# Patient Record
Sex: Male | Born: 1985 | Race: Black or African American | Hispanic: No | Marital: Single | Smoking: Current every day smoker
Health system: Southern US, Community
[De-identification: ages and names within clinical notes are randomized; demographics above are authoritative.]

## PROBLEM LIST (undated history)

## (undated) DIAGNOSIS — G4730 Sleep apnea, unspecified: Secondary | ICD-10-CM

## (undated) DIAGNOSIS — J45909 Unspecified asthma, uncomplicated: Secondary | ICD-10-CM

---

## 2020-10-09 ENCOUNTER — Emergency Department: Payer: Medicaid - Out of State

## 2020-10-09 ENCOUNTER — Encounter: Payer: Self-pay | Admitting: Emergency Medicine

## 2020-10-09 ENCOUNTER — Emergency Department
Admission: EM | Admit: 2020-10-09 | Discharge: 2020-10-09 | Disposition: A | Discharge status: Other Acute Inpatient Hospital | Payer: Medicaid - Out of State | Attending: Emergency Medicine | Admitting: Emergency Medicine

## 2020-10-09 ENCOUNTER — Other Ambulatory Visit: Payer: Self-pay

## 2020-10-09 DIAGNOSIS — E876 Hypokalemia: Secondary | ICD-10-CM | POA: Insufficient documentation

## 2020-10-09 DIAGNOSIS — T23202A Burn of second degree of left hand, unspecified site, initial encounter: Secondary | ICD-10-CM | POA: Insufficient documentation

## 2020-10-09 DIAGNOSIS — T23201A Burn of second degree of right hand, unspecified site, initial encounter: Secondary | ICD-10-CM | POA: Insufficient documentation

## 2020-10-09 DIAGNOSIS — F17210 Nicotine dependence, cigarettes, uncomplicated: Secondary | ICD-10-CM | POA: Insufficient documentation

## 2020-10-09 DIAGNOSIS — Y902 Blood alcohol level of 40-59 mg/100 ml: Secondary | ICD-10-CM | POA: Diagnosis not present

## 2020-10-09 DIAGNOSIS — Z20822 Contact with and (suspected) exposure to covid-19: Secondary | ICD-10-CM | POA: Diagnosis not present

## 2020-10-09 DIAGNOSIS — X04XXXA Exposure to ignition of highly flammable material, initial encounter: Secondary | ICD-10-CM | POA: Diagnosis not present

## 2020-10-09 DIAGNOSIS — T2015XA Burn of first degree of scalp [any part], initial encounter: Secondary | ICD-10-CM | POA: Diagnosis not present

## 2020-10-09 DIAGNOSIS — Z23 Encounter for immunization: Secondary | ICD-10-CM | POA: Diagnosis not present

## 2020-10-09 DIAGNOSIS — T300 Burn of unspecified body region, unspecified degree: Secondary | ICD-10-CM

## 2020-10-09 DIAGNOSIS — E872 Acidosis, unspecified: Secondary | ICD-10-CM

## 2020-10-09 DIAGNOSIS — S0990XA Unspecified injury of head, initial encounter: Secondary | ICD-10-CM | POA: Insufficient documentation

## 2020-10-09 LAB — LACTIC ACID, PLASMA
Lactic Acid, Venous: 4.2 mmol/L (ref 0.5–1.9)
Lactic Acid, Venous: 2.2 mmol/L (ref 0.5–1.9)

## 2020-10-09 LAB — CBC
HCT: 48.1 % (ref 39.0–52.0)
Hemoglobin: 16.3 g/dL (ref 13.0–17.0)
MCH: 30.3 pg (ref 26.0–34.0)
MCHC: 33.9 g/dL (ref 30.0–36.0)
MCV: 89.4 fL (ref 80.0–100.0)
Platelets: 251 10*3/uL (ref 150–400)
RBC: 5.38 MIL/uL (ref 4.22–5.81)
RDW: 14.2 % (ref 11.5–15.5)
WBC: 10.1 10*3/uL (ref 4.0–10.5)
nRBC: 0 % (ref 0.0–0.2)

## 2020-10-09 LAB — COMPREHENSIVE METABOLIC PANEL
ALT: 35 U/L (ref 0–44)
AST: 46 U/L — ABNORMAL HIGH (ref 15–41)
Albumin: 4.1 g/dL (ref 3.5–5.0)
Alkaline Phosphatase: 50 U/L (ref 38–126)
Anion gap: 12 (ref 5–15)
BUN: 17 mg/dL (ref 6–20)
CO2: 23 mmol/L (ref 22–32)
Calcium: 8.7 mg/dL — ABNORMAL LOW (ref 8.9–10.3)
Chloride: 103 mmol/L (ref 98–111)
Creatinine, Ser: 1.15 mg/dL (ref 0.61–1.24)
GFR, Estimated: >60 mL/min (ref >60)
Glucose, Bld: 117 mg/dL — ABNORMAL HIGH (ref 70–99)
Potassium: 3 mmol/L — ABNORMAL LOW (ref 3.5–5.1)
Sodium: 138 mmol/L (ref 135–145)
Total Bilirubin: 0.6 mg/dL (ref 0.3–1.2)
Total Protein: 7.5 g/dL (ref 6.5–8.1)

## 2020-10-09 LAB — SAMPLE TO BLOOD BANK

## 2020-10-09 LAB — MAGNESIUM: Magnesium: 2.1 mg/dL (ref 1.7–2.4)

## 2020-10-09 LAB — PROTIME-INR
INR: 1 (ref 0.8–1.2)
Prothrombin Time: 12.9 seconds (ref 11.4–15.2)

## 2020-10-09 LAB — COOXEMETRY PANEL
Carboxyhemoglobin: 6.4 % (ref 0.5–1.5)
Methemoglobin: 0.3 % (ref 0.0–1.5)
O2 Saturation: 95.9 %
Total oxygen content: 91 mL/dL

## 2020-10-09 LAB — RESP PANEL BY RT-PCR (FLU A&B, COVID) ARPGX2
Influenza A by PCR: NEGATIVE
Influenza B by PCR: NEGATIVE
SARS Coronavirus 2 by RT PCR: NEGATIVE

## 2020-10-09 LAB — ETHANOL: Alcohol, Ethyl (B): 51 mg/dL — ABNORMAL HIGH (ref <10)

## 2020-10-09 LAB — TROPONIN I (HIGH SENSITIVITY): Troponin I (High Sensitivity): 8 ng/L (ref <18)

## 2020-10-09 MED ORDER — ACETAMINOPHEN 500 MG PO TABS
1000.0000 mg | ORAL_TABLET | Freq: Once | ORAL | Status: AC
  Administered 2020-10-09: 04:00:00 1000 mg via ORAL
  Filled 2020-10-09: qty 2

## 2020-10-09 MED ORDER — HYDROMORPHONE HCL 1 MG/ML IJ SOLN
0.5000 mg | Freq: Once | INTRAMUSCULAR | Status: AC
Start: 2020-10-09 — End: 2020-10-09
  Administered 2020-10-09: 08:00:00 0.5 mg via INTRAVENOUS
  Filled 2020-10-09: qty 1

## 2020-10-09 MED ORDER — HYDROMORPHONE HCL 1 MG/ML IJ SOLN
0.5000 mg | Freq: Once | INTRAMUSCULAR | Status: AC
  Administered 2020-10-09: 06:00:00 0.5 mg via INTRAVENOUS
  Filled 2020-10-09: qty 1

## 2020-10-09 MED ORDER — IOHEXOL 300 MG/ML  SOLN
125.0000 mL | Freq: Once | INTRAMUSCULAR | Status: AC | PRN
  Administered 2020-10-09: 06:00:00 125 mL via INTRAVENOUS

## 2020-10-09 MED ORDER — POTASSIUM CHLORIDE CRYS ER 20 MEQ PO TBCR
80.0000 meq | EXTENDED_RELEASE_TABLET | Freq: Once | ORAL | Status: AC
  Administered 2020-10-09: 06:00:00 80 meq via ORAL
  Filled 2020-10-09: qty 4

## 2020-10-09 MED ORDER — LACTATED RINGERS IV BOLUS
1000.0000 mL | Freq: Once | INTRAVENOUS | Status: AC
  Administered 2020-10-09: 04:00:00 1000 mL via INTRAVENOUS

## 2020-10-09 MED ORDER — LACTATED RINGERS IV BOLUS
1000.0000 mL | Freq: Once | INTRAVENOUS | Status: AC
  Administered 2020-10-09: 07:00:00 1000 mL via INTRAVENOUS

## 2020-10-09 MED ORDER — TETANUS-DIPHTH-ACELL PERTUSSIS 5-2.5-18.5 LF-MCG/0.5 IM SUSY
0.5000 mL | PREFILLED_SYRINGE | Freq: Once | INTRAMUSCULAR | Status: AC
  Administered 2020-10-09: 04:00:00 0.5 mL via INTRAMUSCULAR
  Filled 2020-10-09: qty 0.5

## 2020-10-09 MED ORDER — KETOROLAC TROMETHAMINE 30 MG/ML IJ SOLN
30.0000 mg | Freq: Once | INTRAMUSCULAR | Status: AC
  Administered 2020-10-09: 08:00:00 30 mg via INTRAVENOUS
  Filled 2020-10-09: qty 1

## 2020-10-09 NOTE — ED Notes (Signed)
Dr Scotty Court notified of critical lactic acid 4.2
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
## 2020-10-09 NOTE — ED Notes (Signed)
ED Provider at bedside.  Provider now speaking with pt mother --pt now to CT dept via stretcher
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
## 2021-03-21 ENCOUNTER — Inpatient Hospital Stay (HOSPITAL_COMMUNITY): Payer: Self-pay

## 2021-03-21 ENCOUNTER — Other Ambulatory Visit: Payer: Self-pay

## 2021-03-21 ENCOUNTER — Encounter (HOSPITAL_COMMUNITY): Payer: Self-pay | Admitting: *Deleted

## 2021-03-21 ENCOUNTER — Emergency Department (HOSPITAL_COMMUNITY): Payer: Self-pay

## 2021-03-21 ENCOUNTER — Emergency Department (HOSPITAL_COMMUNITY): Payer: Self-pay | Admitting: Certified Registered"

## 2021-03-21 ENCOUNTER — Encounter (HOSPITAL_COMMUNITY)
Admission: EM | Disposition: A | Discharge status: Home / Self Care | Payer: Self-pay | Source: Home / Self Care | Attending: Orthopedic Surgery

## 2021-03-21 ENCOUNTER — Inpatient Hospital Stay (HOSPITAL_COMMUNITY)
Admission: EM | Admit: 2021-03-21 | Discharge: 2021-03-27 | DRG: 956 | Disposition: A | Discharge status: Home / Self Care | Payer: Self-pay | Attending: Orthopedic Surgery | Admitting: Orthopedic Surgery

## 2021-03-21 DIAGNOSIS — D649 Anemia, unspecified: Secondary | ICD-10-CM | POA: Diagnosis present

## 2021-03-21 DIAGNOSIS — S52511A Displaced fracture of right radial styloid process, initial encounter for closed fracture: Secondary | ICD-10-CM | POA: Diagnosis present

## 2021-03-21 DIAGNOSIS — S83271A Complex tear of lateral meniscus, current injury, right knee, initial encounter: Secondary | ICD-10-CM | POA: Diagnosis present

## 2021-03-21 DIAGNOSIS — N3090 Cystitis, unspecified without hematuria: Secondary | ICD-10-CM | POA: Diagnosis present

## 2021-03-21 DIAGNOSIS — G4730 Sleep apnea, unspecified: Secondary | ICD-10-CM | POA: Diagnosis present

## 2021-03-21 DIAGNOSIS — S27329A Contusion of lung, unspecified, initial encounter: Secondary | ICD-10-CM | POA: Diagnosis present

## 2021-03-21 DIAGNOSIS — S7291XA Unspecified fracture of right femur, initial encounter for closed fracture: Secondary | ICD-10-CM | POA: Diagnosis present

## 2021-03-21 DIAGNOSIS — Z20822 Contact with and (suspected) exposure to covid-19: Secondary | ICD-10-CM | POA: Diagnosis present

## 2021-03-21 DIAGNOSIS — F1290 Cannabis use, unspecified, uncomplicated: Secondary | ICD-10-CM | POA: Diagnosis present

## 2021-03-21 DIAGNOSIS — Z419 Encounter for procedure for purposes other than remedying health state, unspecified: Secondary | ICD-10-CM

## 2021-03-21 DIAGNOSIS — Z8781 Personal history of (healed) traumatic fracture: Secondary | ICD-10-CM

## 2021-03-21 DIAGNOSIS — S82141A Displaced bicondylar fracture of right tibia, initial encounter for closed fracture: Secondary | ICD-10-CM | POA: Diagnosis present

## 2021-03-21 DIAGNOSIS — Y92410 Unspecified street and highway as the place of occurrence of the external cause: Secondary | ICD-10-CM

## 2021-03-21 DIAGNOSIS — S72301A Unspecified fracture of shaft of right femur, initial encounter for closed fracture: Secondary | ICD-10-CM

## 2021-03-21 DIAGNOSIS — S52501A Unspecified fracture of the lower end of right radius, initial encounter for closed fracture: Secondary | ICD-10-CM | POA: Diagnosis present

## 2021-03-21 DIAGNOSIS — R000 Tachycardia, unspecified: Secondary | ICD-10-CM | POA: Diagnosis not present

## 2021-03-21 DIAGNOSIS — R509 Fever, unspecified: Secondary | ICD-10-CM | POA: Diagnosis not present

## 2021-03-21 DIAGNOSIS — J45909 Unspecified asthma, uncomplicated: Secondary | ICD-10-CM | POA: Diagnosis present

## 2021-03-21 DIAGNOSIS — S72114A Nondisplaced fracture of greater trochanter of right femur, initial encounter for closed fracture: Principal | ICD-10-CM | POA: Diagnosis present

## 2021-03-21 DIAGNOSIS — E876 Hypokalemia: Secondary | ICD-10-CM | POA: Diagnosis present

## 2021-03-21 DIAGNOSIS — I10 Essential (primary) hypertension: Secondary | ICD-10-CM | POA: Diagnosis not present

## 2021-03-21 DIAGNOSIS — Z6839 Body mass index (BMI) 39.0-39.9, adult: Secondary | ICD-10-CM

## 2021-03-21 DIAGNOSIS — E559 Vitamin D deficiency, unspecified: Secondary | ICD-10-CM | POA: Diagnosis present

## 2021-03-21 DIAGNOSIS — T148XXA Other injury of unspecified body region, initial encounter: Secondary | ICD-10-CM

## 2021-03-21 DIAGNOSIS — D62 Acute posthemorrhagic anemia: Secondary | ICD-10-CM | POA: Diagnosis not present

## 2021-03-21 DIAGNOSIS — T1490XA Injury, unspecified, initial encounter: Secondary | ICD-10-CM

## 2021-03-21 DIAGNOSIS — F17200 Nicotine dependence, unspecified, uncomplicated: Secondary | ICD-10-CM | POA: Diagnosis present

## 2021-03-21 HISTORY — DX: Sleep apnea, unspecified: G47.30

## 2021-03-21 HISTORY — PX: FEMUR IM NAIL: SHX1597

## 2021-03-21 HISTORY — DX: Unspecified asthma, uncomplicated: J45.909

## 2021-03-21 LAB — PROTIME-INR
INR: 1 (ref 0.8–1.2)
Prothrombin Time: 13.5 seconds (ref 11.4–15.2)

## 2021-03-21 LAB — COMPREHENSIVE METABOLIC PANEL
ALT: 16 U/L (ref 0–44)
AST: 22 U/L (ref 15–41)
Albumin: 3.8 g/dL (ref 3.5–5.0)
Alkaline Phosphatase: 53 U/L (ref 38–126)
Anion gap: 11 (ref 5–15)
BUN: 10 mg/dL (ref 6–20)
CO2: 25 mmol/L (ref 22–32)
Calcium: 8.6 mg/dL — ABNORMAL LOW (ref 8.9–10.3)
Chloride: 100 mmol/L (ref 98–111)
Creatinine, Ser: 1.29 mg/dL — ABNORMAL HIGH (ref 0.61–1.24)
GFR, Estimated: >60 mL/min (ref >60)
Glucose, Bld: 117 mg/dL — ABNORMAL HIGH (ref 70–99)
Potassium: 3.1 mmol/L — ABNORMAL LOW (ref 3.5–5.1)
Sodium: 136 mmol/L (ref 135–145)
Total Bilirubin: 0.6 mg/dL (ref 0.3–1.2)
Total Protein: 6.4 g/dL — ABNORMAL LOW (ref 6.5–8.1)

## 2021-03-21 LAB — SAMPLE TO BLOOD BANK

## 2021-03-21 LAB — URINALYSIS, ROUTINE W REFLEX MICROSCOPIC
Bilirubin Urine: NEGATIVE
Glucose, UA: NEGATIVE mg/dL
Hgb urine dipstick: NEGATIVE
Ketones, ur: NEGATIVE mg/dL
Nitrite: NEGATIVE
Protein, ur: NEGATIVE mg/dL
Specific Gravity, Urine: >1.046 — ABNORMAL HIGH (ref 1.005–1.030)
pH: 5 (ref 5.0–8.0)

## 2021-03-21 LAB — CBC
HCT: 48.2 % (ref 39.0–52.0)
Hemoglobin: 15.9 g/dL (ref 13.0–17.0)
MCH: 29.9 pg (ref 26.0–34.0)
MCHC: 33 g/dL (ref 30.0–36.0)
MCV: 90.8 fL (ref 80.0–100.0)
Platelets: 228 10*3/uL (ref 150–400)
RBC: 5.31 MIL/uL (ref 4.22–5.81)
RDW: 14.4 % (ref 11.5–15.5)
WBC: 9.2 10*3/uL (ref 4.0–10.5)
nRBC: 0 % (ref 0.0–0.2)

## 2021-03-21 LAB — LACTIC ACID, PLASMA: Lactic Acid, Venous: 3.8 mmol/L (ref 0.5–1.9)

## 2021-03-21 LAB — ETHANOL: Alcohol, Ethyl (B): 19 mg/dL — ABNORMAL HIGH (ref <10)

## 2021-03-21 LAB — RESP PANEL BY RT-PCR (FLU A&B, COVID) ARPGX2
Influenza A by PCR: NEGATIVE
Influenza B by PCR: NEGATIVE
SARS Coronavirus 2 by RT PCR: NEGATIVE

## 2021-03-21 LAB — SURGICAL PCR SCREEN
MRSA, PCR: NEGATIVE
Staphylococcus aureus: NEGATIVE

## 2021-03-21 SURGERY — INSERTION, INTRAMEDULLARY ROD, FEMUR
Anesthesia: General | Site: Leg Upper | Laterality: Right

## 2021-03-21 MED ORDER — SUCCINYLCHOLINE CHLORIDE 200 MG/10ML IV SOSY
PREFILLED_SYRINGE | INTRAVENOUS | Status: AC
  Filled 2021-03-21: qty 10

## 2021-03-21 MED ORDER — FENTANYL CITRATE (PF) 100 MCG/2ML IJ SOLN
INTRAMUSCULAR | Status: AC
  Filled 2021-03-21: qty 2

## 2021-03-21 MED ORDER — MORPHINE SULFATE (PF) 4 MG/ML IV SOLN
INTRAVENOUS | Status: DC | PRN
  Administered 2021-03-21: 8 mg

## 2021-03-21 MED ORDER — HYDROMORPHONE HCL 1 MG/ML IJ SOLN
1.0000 mg | Freq: Once | INTRAMUSCULAR | Status: AC
  Administered 2021-03-21: 1 mg via INTRAVENOUS
  Filled 2021-03-21: qty 1

## 2021-03-21 MED ORDER — DOCUSATE SODIUM 100 MG PO CAPS
100.0000 mg | ORAL_CAPSULE | Freq: Two times a day (BID) | ORAL | Status: DC
  Administered 2021-03-21 – 2021-03-27 (×12): 100 mg via ORAL
  Filled 2021-03-21 (×12): qty 1

## 2021-03-21 MED ORDER — TRANEXAMIC ACID-NACL 1000-0.7 MG/100ML-% IV SOLN
1000.0000 mg | Freq: Once | INTRAVENOUS | Status: AC
  Administered 2021-03-21: 1000 mg via INTRAVENOUS
  Filled 2021-03-21: qty 100

## 2021-03-21 MED ORDER — LIDOCAINE 2% (20 MG/ML) 5 ML SYRINGE
INTRAMUSCULAR | Status: DC | PRN
  Administered 2021-03-21: 100 mg via INTRAVENOUS

## 2021-03-21 MED ORDER — CHLORHEXIDINE GLUCONATE 0.12 % MT SOLN
OROMUCOSAL | Status: AC
  Administered 2021-03-21: 15 mL via OROMUCOSAL
  Filled 2021-03-21: qty 15

## 2021-03-21 MED ORDER — MIDAZOLAM HCL 5 MG/5ML IJ SOLN
INTRAMUSCULAR | Status: DC | PRN
  Administered 2021-03-21 (×2): 1 mg via INTRAVENOUS

## 2021-03-21 MED ORDER — CHLORHEXIDINE GLUCONATE 4 % EX LIQD: 60.0000 mL | Freq: Once | CUTANEOUS | Status: DC

## 2021-03-21 MED ORDER — SODIUM CHLORIDE 0.9 % IV SOLN: INTRAVENOUS | Status: DC

## 2021-03-21 MED ORDER — BUPIVACAINE HCL (PF) 0.25 % IJ SOLN
INTRAMUSCULAR | Status: AC
  Filled 2021-03-21: qty 30

## 2021-03-21 MED ORDER — SUGAMMADEX SODIUM 200 MG/2ML IV SOLN
INTRAVENOUS | Status: DC | PRN
  Administered 2021-03-21: 300 mg via INTRAVENOUS

## 2021-03-21 MED ORDER — TRANEXAMIC ACID-NACL 1000-0.7 MG/100ML-% IV SOLN
1000.0000 mg | INTRAVENOUS | Status: AC
  Administered 2021-03-21: 1000 mg via INTRAVENOUS
  Filled 2021-03-21: qty 100

## 2021-03-21 MED ORDER — METOCLOPRAMIDE HCL 5 MG PO TABS
5.0000 mg | ORAL_TABLET | Freq: Three times a day (TID) | ORAL | Status: DC | PRN
Start: 2021-03-21 — End: 2021-03-27
  Filled 2021-03-21: qty 2

## 2021-03-21 MED ORDER — OXYCODONE HCL 5 MG PO TABS
5.0000 mg | ORAL_TABLET | ORAL | Status: DC | PRN
Start: 2021-03-21 — End: 2021-03-27
  Administered 2021-03-21 – 2021-03-23 (×8): 10 mg via ORAL
  Administered 2021-03-23: 5 mg via ORAL
  Administered 2021-03-24 – 2021-03-27 (×13): 10 mg via ORAL
  Administered 2021-03-27: 5 mg via ORAL
  Administered 2021-03-27 (×2): 10 mg via ORAL
  Filled 2021-03-21 (×6): qty 2
  Filled 2021-03-21: qty 1
  Filled 2021-03-21 (×17): qty 2

## 2021-03-21 MED ORDER — DEXAMETHASONE SODIUM PHOSPHATE 4 MG/ML IJ SOLN
INTRAMUSCULAR | Status: DC | PRN
  Administered 2021-03-21: 4 mg via INTRAVENOUS

## 2021-03-21 MED ORDER — ROCURONIUM BROMIDE 10 MG/ML (PF) SYRINGE
PREFILLED_SYRINGE | INTRAVENOUS | Status: AC
  Filled 2021-03-21: qty 10

## 2021-03-21 MED ORDER — MIDAZOLAM HCL 2 MG/2ML IJ SOLN
INTRAMUSCULAR | Status: AC
  Filled 2021-03-21: qty 2

## 2021-03-21 MED ORDER — FENTANYL CITRATE (PF) 100 MCG/2ML IJ SOLN
INTRAMUSCULAR | Status: DC | PRN
  Administered 2021-03-21 (×5): 50 ug via INTRAVENOUS

## 2021-03-21 MED ORDER — LACTATED RINGERS IV SOLN: INTRAVENOUS | Status: AC

## 2021-03-21 MED ORDER — IOHEXOL 300 MG/ML  SOLN
100.0000 mL | Freq: Once | INTRAMUSCULAR | Status: AC | PRN
  Administered 2021-03-21: 100 mL via INTRAVENOUS

## 2021-03-21 MED ORDER — PROPOFOL 10 MG/ML IV BOLUS
INTRAVENOUS | Status: DC | PRN
  Administered 2021-03-21: 200 mg via INTRAVENOUS

## 2021-03-21 MED ORDER — FENTANYL CITRATE (PF) 250 MCG/5ML IJ SOLN
INTRAMUSCULAR | Status: AC
  Filled 2021-03-21: qty 5

## 2021-03-21 MED ORDER — CLONIDINE HCL (ANALGESIA) 100 MCG/ML EP SOLN
EPIDURAL | Status: DC | PRN
  Administered 2021-03-21: 1 mL

## 2021-03-21 MED ORDER — SODIUM CHLORIDE 0.9 % IV BOLUS
1000.0000 mL | Freq: Once | INTRAVENOUS | Status: AC
  Administered 2021-03-21: 1000 mL via INTRAVENOUS

## 2021-03-21 MED ORDER — ONDANSETRON HCL 4 MG PO TABS: 4.0000 mg | ORAL_TABLET | Freq: Four times a day (QID) | ORAL | Status: DC | PRN

## 2021-03-21 MED ORDER — POTASSIUM CHLORIDE CRYS ER 20 MEQ PO TBCR
20.0000 meq | EXTENDED_RELEASE_TABLET | Freq: Every day | ORAL | Status: DC
  Administered 2021-03-22 – 2021-03-27 (×5): 20 meq via ORAL
  Filled 2021-03-21 (×7): qty 1

## 2021-03-21 MED ORDER — METHOCARBAMOL 500 MG PO TABS
500.0000 mg | ORAL_TABLET | Freq: Four times a day (QID) | ORAL | Status: DC | PRN
  Administered 2021-03-21 – 2021-03-27 (×10): 500 mg via ORAL
  Filled 2021-03-21 (×9): qty 1

## 2021-03-21 MED ORDER — METHOCARBAMOL 1000 MG/10ML IJ SOLN
500.0000 mg | Freq: Four times a day (QID) | INTRAVENOUS | Status: DC | PRN
  Filled 2021-03-21: qty 5

## 2021-03-21 MED ORDER — BUPIVACAINE HCL 0.25 % IJ SOLN
INTRAMUSCULAR | Status: DC | PRN
  Administered 2021-03-21: 30 mL

## 2021-03-21 MED ORDER — METOCLOPRAMIDE HCL 5 MG/ML IJ SOLN
5.0000 mg | Freq: Three times a day (TID) | INTRAMUSCULAR | Status: DC | PRN
Start: 2021-03-21 — End: 2021-03-27

## 2021-03-21 MED ORDER — ONDANSETRON HCL 4 MG/2ML IJ SOLN
INTRAMUSCULAR | Status: AC
  Filled 2021-03-21: qty 2

## 2021-03-21 MED ORDER — CEFAZOLIN SODIUM-DEXTROSE 2-4 GM/100ML-% IV SOLN
2.0000 g | INTRAVENOUS | Status: AC
  Administered 2021-03-21: 2 g via INTRAVENOUS
  Filled 2021-03-21: qty 100

## 2021-03-21 MED ORDER — PHENYLEPHRINE 40 MCG/ML (10ML) SYRINGE FOR IV PUSH (FOR BLOOD PRESSURE SUPPORT)
PREFILLED_SYRINGE | INTRAVENOUS | Status: AC
  Filled 2021-03-21: qty 10

## 2021-03-21 MED ORDER — CLONIDINE HCL (ANALGESIA) 100 MCG/ML EP SOLN
EPIDURAL | Status: AC
  Filled 2021-03-21: qty 10

## 2021-03-21 MED ORDER — LIDOCAINE 2% (20 MG/ML) 5 ML SYRINGE
INTRAMUSCULAR | Status: AC
  Filled 2021-03-21: qty 5

## 2021-03-21 MED ORDER — POVIDONE-IODINE 10 % EX SWAB: 2.0000 "application " | Freq: Once | CUTANEOUS | Status: DC

## 2021-03-21 MED ORDER — ONDANSETRON HCL 4 MG/2ML IJ SOLN
INTRAMUSCULAR | Status: DC | PRN
  Administered 2021-03-21: 4 mg via INTRAVENOUS

## 2021-03-21 MED ORDER — PROMETHAZINE HCL 25 MG/ML IJ SOLN: 6.2500 mg | INTRAMUSCULAR | Status: DC | PRN

## 2021-03-21 MED ORDER — MORPHINE SULFATE (PF) 4 MG/ML IV SOLN
INTRAVENOUS | Status: AC
  Filled 2021-03-21: qty 2

## 2021-03-21 MED ORDER — PHENOL 1.4 % MT LIQD
1.0000 | OROMUCOSAL | Status: DC | PRN
Start: 2021-03-21 — End: 2021-03-27

## 2021-03-21 MED ORDER — CEPHALEXIN 500 MG PO CAPS
500.0000 mg | ORAL_CAPSULE | Freq: Two times a day (BID) | ORAL | Status: DC
  Administered 2021-03-21 – 2021-03-22 (×3): 500 mg via ORAL
  Filled 2021-03-21 (×3): qty 1
  Filled 2021-03-21: qty 2

## 2021-03-21 MED ORDER — ASPIRIN EC 325 MG PO TBEC
325.0000 mg | DELAYED_RELEASE_TABLET | Freq: Every day | ORAL | Status: DC
  Administered 2021-03-22: 325 mg via ORAL
  Filled 2021-03-21: qty 1

## 2021-03-21 MED ORDER — ONDANSETRON HCL 4 MG/2ML IJ SOLN: 4.0000 mg | Freq: Four times a day (QID) | INTRAMUSCULAR | Status: DC | PRN

## 2021-03-21 MED ORDER — PROPOFOL 10 MG/ML IV BOLUS
INTRAVENOUS | Status: AC
  Filled 2021-03-21: qty 20

## 2021-03-21 MED ORDER — 0.9 % SODIUM CHLORIDE (POUR BTL) OPTIME
TOPICAL | Status: DC | PRN
  Administered 2021-03-21 (×2): 1000 mL

## 2021-03-21 MED ORDER — ACETAMINOPHEN 500 MG PO TABS
1000.0000 mg | ORAL_TABLET | Freq: Four times a day (QID) | ORAL | Status: AC
  Administered 2021-03-21 – 2021-03-22 (×3): 1000 mg via ORAL
  Filled 2021-03-21 (×4): qty 2

## 2021-03-21 MED ORDER — CEFAZOLIN SODIUM-DEXTROSE 2-4 GM/100ML-% IV SOLN
2.0000 g | Freq: Three times a day (TID) | INTRAVENOUS | Status: AC
  Administered 2021-03-21 (×2): 2 g via INTRAVENOUS
  Filled 2021-03-21 (×2): qty 100

## 2021-03-21 MED ORDER — ORAL CARE MOUTH RINSE: 15.0000 mL | Freq: Once | OROMUCOSAL | Status: AC

## 2021-03-21 MED ORDER — FENTANYL CITRATE (PF) 100 MCG/2ML IJ SOLN
25.0000 ug | INTRAMUSCULAR | Status: DC | PRN
  Administered 2021-03-21 (×2): 50 ug via INTRAVENOUS

## 2021-03-21 MED ORDER — HYDROMORPHONE HCL 1 MG/ML IJ SOLN
0.5000 mg | INTRAMUSCULAR | Status: DC | PRN
  Administered 2021-03-23 – 2021-03-27 (×11): 0.5 mg via INTRAVENOUS
  Filled 2021-03-21 (×6): qty 1
  Filled 2021-03-21: qty 0.5
  Filled 2021-03-21: qty 1
  Filled 2021-03-21: qty 0.5
  Filled 2021-03-21 (×3): qty 1

## 2021-03-21 MED ORDER — MENTHOL 3 MG MT LOZG: 1.0000 | LOZENGE | OROMUCOSAL | Status: DC | PRN

## 2021-03-21 MED ORDER — ACETAMINOPHEN 325 MG PO TABS
325.0000 mg | ORAL_TABLET | Freq: Four times a day (QID) | ORAL | Status: DC | PRN
  Administered 2021-03-25: 650 mg via ORAL
  Filled 2021-03-21: qty 2

## 2021-03-21 MED ORDER — CHLORHEXIDINE GLUCONATE 0.12 % MT SOLN: 15.0000 mL | Freq: Once | OROMUCOSAL | Status: AC

## 2021-03-21 MED ORDER — LACTATED RINGERS IV SOLN: INTRAVENOUS | Status: DC

## 2021-03-21 MED ORDER — ACETAMINOPHEN 10 MG/ML IV SOLN: 1000.0000 mg | Freq: Once | INTRAVENOUS | Status: DC | PRN

## 2021-03-21 MED ORDER — ROCURONIUM BROMIDE 100 MG/10ML IV SOLN
INTRAVENOUS | Status: DC | PRN
  Administered 2021-03-21: 30 mg via INTRAVENOUS
  Administered 2021-03-21: 70 mg via INTRAVENOUS
  Administered 2021-03-21: 20 mg via INTRAVENOUS

## 2021-03-21 MED ORDER — DEXAMETHASONE SODIUM PHOSPHATE 10 MG/ML IJ SOLN
INTRAMUSCULAR | Status: AC
  Filled 2021-03-21: qty 1

## 2021-03-21 SURGICAL SUPPLY — 61 items
BIT DRILL LONG 4.0 (BIT) ×1 IMPLANT
BIT DRILL SHORT 4.0 (BIT) ×2 IMPLANT
BLADE CLIPPER SURG (BLADE) ×2 IMPLANT
BLADE SURG 15 STRL LF DISP TIS (BLADE) IMPLANT
BLADE SURG 15 STRL SS (BLADE)
COVER MAYO STAND STRL (DRAPES) ×2 IMPLANT
COVER PERINEAL POST (MISCELLANEOUS) ×2 IMPLANT
COVER SURGICAL LIGHT HANDLE (MISCELLANEOUS) ×2 IMPLANT
COVER WAND RF STERILE (DRAPES) ×2 IMPLANT
DRAPE HALF SHEET 40X57 (DRAPES) IMPLANT
DRAPE INCISE IOBAN 66X45 STRL (DRAPES) ×2 IMPLANT
DRAPE ORTHO SPLIT 77X108 STRL (DRAPES)
DRAPE STERI IOBAN 125X83 (DRAPES) ×2 IMPLANT
DRAPE SURG ORHT 6 SPLT 77X108 (DRAPES) IMPLANT
DRESSING AQUACEL AG SP 3.5X6 (GAUZE/BANDAGES/DRESSINGS) ×2 IMPLANT
DRILL BIT LONG 4.0 (BIT) ×2
DRILL BIT SHORT 4.0 (BIT) ×2
DRSG AQUACEL AG SP 3.5X6 (GAUZE/BANDAGES/DRESSINGS) ×4
DRSG MEPILEX BORDER 4X4 (GAUZE/BANDAGES/DRESSINGS) IMPLANT
DRSG MEPILEX BORDER 4X8 (GAUZE/BANDAGES/DRESSINGS) IMPLANT
DURAPREP 26ML APPLICATOR (WOUND CARE) ×2 IMPLANT
ELECT REM PT RETURN 9FT ADLT (ELECTROSURGICAL) ×2
ELECTRODE REM PT RTRN 9FT ADLT (ELECTROSURGICAL) ×1 IMPLANT
FACESHIELD WRAPAROUND (MASK) ×2 IMPLANT
GAUZE XEROFORM 5X9 LF (GAUZE/BANDAGES/DRESSINGS) IMPLANT
GLOVE ECLIPSE 8.0 STRL XLNG CF (GLOVE) ×4 IMPLANT
GLOVE SRG 8 PF TXTR STRL LF DI (GLOVE) ×1 IMPLANT
GLOVE SURG UNDER POLY LF SZ8 (GLOVE) ×1
GOWN STRL REUS W/ TWL LRG LVL3 (GOWN DISPOSABLE) ×2 IMPLANT
GOWN STRL REUS W/ TWL XL LVL3 (GOWN DISPOSABLE) ×2 IMPLANT
GOWN STRL REUS W/TWL LRG LVL3 (GOWN DISPOSABLE) ×2
GOWN STRL REUS W/TWL XL LVL3 (GOWN DISPOSABLE) ×2
GUIDE PIN 3.2X343 (PIN) ×2
GUIDE PIN 3.2X343MM (PIN) ×2
GUIDE ROD 3.0 (MISCELLANEOUS) ×2
KIT BASIN OR (CUSTOM PROCEDURE TRAY) ×2 IMPLANT
KIT TURNOVER KIT B (KITS) ×2 IMPLANT
MANIFOLD NEPTUNE II (INSTRUMENTS) IMPLANT
NAIL LOCK COMPR 11.5X42 RT (Nail) ×2 IMPLANT
NEEDLE HYPO 25GX1X1/2 BEV (NEEDLE) ×2 IMPLANT
NS IRRIG 1000ML POUR BTL (IV SOLUTION) ×2 IMPLANT
PACK GENERAL/GYN (CUSTOM PROCEDURE TRAY) ×2 IMPLANT
PAD ARMBOARD 7.5X6 YLW CONV (MISCELLANEOUS) ×4 IMPLANT
PIN GUIDE 3.2X343MM (PIN) ×2 IMPLANT
ROD GUIDE 3.0 (MISCELLANEOUS) ×1 IMPLANT
SCREW TRIGEN LOW PROF 5.0X47.5 (Screw) ×2 IMPLANT
SCREW TRIGEN LOW PROF 5.0X57.5 (Screw) ×2 IMPLANT
SCREW TRIGEN LOW PROF 5.0X72.5 (Screw) ×2 IMPLANT
SPONGE LAP 4X18 RFD (DISPOSABLE) IMPLANT
STAPLER VISISTAT 35W (STAPLE) ×2 IMPLANT
SUT ETHILON 2 0 FS 18 (SUTURE) IMPLANT
SUT ETHILON 3 0 FSL (SUTURE) ×4 IMPLANT
SUT VIC AB 0 CTX 36 (SUTURE) ×1
SUT VIC AB 0 CTX36XBRD ANBCTRL (SUTURE) ×1 IMPLANT
SUT VIC AB 1 CT1 27 (SUTURE) ×2
SUT VIC AB 1 CT1 27XBRD ANBCTR (SUTURE) ×2 IMPLANT
SUT VIC AB 2-0 CTB1 (SUTURE) ×4 IMPLANT
TAPE STRIPS DRAPE STRL (GAUZE/BANDAGES/DRESSINGS) ×2 IMPLANT
TOWEL GREEN STERILE (TOWEL DISPOSABLE) ×2 IMPLANT
TOWEL GREEN STERILE FF (TOWEL DISPOSABLE) ×2 IMPLANT
WATER STERILE IRR 1000ML POUR (IV SOLUTION) ×2 IMPLANT
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
## 2021-03-21 NOTE — Progress Notes (Signed)
Douglas Bishop is a 35 y.o. male patient admitted. Awake, alert - oriented  X 4 - no acute distress noted.  VSS - Blood pressure 131/84, pulse 88, temperature 97.7 F (36.5 C), temperature source Axillary, resp. rate 18, height 5\' 7"  (1.702 m), weight 113.4 kg, SpO2 95 %.    IV in place, occlusive dsg intact without redness.  Orientation to room, and floor completed.  Admission INP armband ID verified with patient/family, and in place.   SR up x 2, fall assessment complete, with patient and family able to verbalize understanding of risk associated with falls, and verbalized understanding to call nsg before up out of bed.  Call light within reach, patient able to voice, and demonstrate understanding. No evidence of skin break down noted on exam.  Admission nurse notified of admission.     Will cont to eval and treat per MD orders.  , RN 03/21/2021 11:54 AM
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
## 2021-03-21 NOTE — Anesthesia Procedure Notes (Signed)
Procedure Name: Intubation Date/Time: 03/21/2021 7:49 AM Performed by: Sonda Primes, CRNA Pre-anesthesia Checklist: Patient identified, Emergency Drugs available, Suction available and Patient being monitored Patient Re-evaluated:Patient Re-evaluated prior to induction Oxygen Delivery Method: Circle System Utilized and Circle system utilized Preoxygenation: Pre-oxygenation with 100% oxygen Induction Type: IV induction Ventilation: Mask ventilation without difficulty, Two handed mask ventilation required and Oral airway inserted - appropriate to patient size Laryngoscope Size: Glidescope and 4 Grade View: Grade I Tube type: Oral Number of attempts: 1 Airway Equipment and Method: Stylet and Oral airway Placement Confirmation: ETT inserted through vocal cords under direct vision,  positive ETCO2 and breath sounds checked- equal and bilateral Secured at: 23 cm Tube secured with: Tape Dental Injury: Teeth and Oropharynx as per pre-operative assessment

## 2021-03-21 NOTE — Op Note (Signed)
Douglas Bishop, [name] MEDICAL RECORD NO: [id number] ACCOUNT NO: [id number] DATE OF BIRTH: 1986-06-06 FACILITY: [facility] LOCATION: [id number] PHYSICIAN: Graylin Shiver. August Saucer, MD  Operative Report   DATE OF PROCEDURE: 03/21/2021  PREOPERATIVE DIAGNOSIS:  Right femur fracture.  POSTOPERATIVE DIAGNOSIS:  Right femur fracture.  PROCEDURE:  Intramedullary nail, right femur using [employer] META-TAN 42 cm x 11.5 mm.  SURGEON ATTENDING:  Graylin Shiver. August Saucer, MD  ASSISTANT:  Karenann Cai, PA.  INDICATIONS:  The patient is a 35 year old patient with right femur fracture who presents for operative management of this after explanation of risks and benefits.  DESCRIPTION OF PROCEDURE:  Patient was brought to the operating room where general anesthetic was induced.  Preoperative antibiotics were administered.  Timeout was called.  The patient was placed on the fracture bed with the left leg in lithotomy  position.  Peroneal nerve well padded.  Traction was applied.  Fracture was reducible.  The timeout was called, pre-scrubbing was performed with alcohol and Betadine allowed to air dry.  Prepped with DuraPrep solution and draped in a sterile manner.   Wall drape utilized.  Incision made 4 fingerbreadths proximal to the tip of the trochanter.  Skin and subcutaneous tissue were sharply divided.  The guide pin was placed into the tip of the trochanter into the lesser.  Correct placement confirmed in the  AP and lateral planes under fluoroscopy.  Proximal reaming performed.  Fracture reducer placed.  Fracture was reduced.  Guide pin placed across the fracture.  Reaming performed up to 13 mm.  A 42 x 11.5 mm nail was placed with good rotational alignment  maintained.  This was evaluated both preoperatively as well as at the time of the surgery.  The fracture was reduced on the nail to decrease the gap.  One proximal interlocking screw was placed, 2 distal interlocking screws were placed under fluoroscopic  guidance with  good purchase obtained.  Thorough irrigation performed on all incisions.  Smaller incisions were closed using 2-0 Vicryl, 3-0 nylon.  The larger incision proximally closed using #1 Vicryl suture followed by interrupted inverted 0 Vicryl  suture, 2-0 Vicryl suture and 3-0 nylon.  It should be noted that a solution of Marcaine, morphine, clonidine injected into these incisions.  Aquacel dressings utilized.  POSTOPERATIVE PLAN:  The patient will be partial weightbearing as tolerated with crutches.  Luke's assistance was required at all times during the case for retraction, opening and closing, mobilization of tissue.  His assistance was a medical necessity.   PUS D: 03/21/2021 9:49:30 am T: 03/21/2021 3:36:00 pm  JOB: [id number]/ [id number]

## 2021-03-21 NOTE — ED Notes (Signed)
Raiford Simmonds [phone] sister. Mother Beverley Fiedler [phone]

## 2021-03-21 NOTE — Progress Notes (Signed)
Patient's postop radiographs look good on the right femur.  Lateral tibial plateau fracture is present along with nondisplaced right wrist fracture.  Plan for splint immobilization of the right wrist with no loadbearing through the right wrist.  Orthopedic trauma specialist consultation for treatment of tibial plateau fracture.  Patient should be nonweightbearing at this time.

## 2021-03-21 NOTE — Brief Op Note (Signed)
   03/21/2021  9:45 AM  PATIENT:  Douglas Bishop  35 y.o. male  PRE-OPERATIVE DIAGNOSIS:  RIGHT FEMUR FRACTURE  POST-OPERATIVE DIAGNOSIS:  Right femur fracture  PROCEDURE:  Procedure(s): INTRAMEDULLARY (IM) NAIL FEMORAL  SURGEON:  Surgeon(s): August Saucer, Corrie Mckusick, MD  ASSISTANT: magnant pa  ANESTHESIA:   general  EBL: 200 ml    Total I/O In: 700 [I.V.:700] Out: 200 [Blood:200]  BLOOD ADMINISTERED: none  DRAINS: none   LOCAL MEDICATIONS USED:  Marcaine mso4 clonidine  SPECIMEN:  No Specimen  COUNTS:  YES  TOURNIQUET:  * No tourniquets in log *  DICTATION: .Other Dictation: Dictation Number [id number]  PLAN OF CARE: Admit for overnight observation  PATIENT DISPOSITION:  PACU - hemodynamically stable

## 2021-03-21 NOTE — ED Notes (Signed)
c-collar removed per providers request. Pt adjusted in bed at this time. Ortho tech at bedside to apply traction splint

## 2021-03-21 NOTE — ED Notes (Signed)
Spoke with [facility] in reference to pt enroute to floor

## 2021-03-21 NOTE — Anesthesia Postprocedure Evaluation (Signed)
Anesthesia Post Note  Patient: [name]  Procedure(s) Performed: INTRAMEDULLARY (IM) NAIL FEMORAL (Right Leg Upper)     Patient location during evaluation: PACU Anesthesia Type: General Level of consciousness: awake and alert Pain management: pain level controlled Vital Signs Assessment: post-procedure vital signs reviewed and stable Respiratory status: spontaneous breathing, nonlabored ventilation, respiratory function stable and patient connected to nasal cannula oxygen Cardiovascular status: blood pressure returned to baseline and stable Postop Assessment: no apparent nausea or vomiting Anesthetic complications: no   No complications documented.  Last Vitals:  Vitals:   03/21/21 1754 03/21/21 2050  BP: (!) 143/103 (!) 145/93  Pulse: 100 99  Resp: 18 19  Temp: 37.1 C 36.8 C  SpO2: 100% 92%    Last Pain:  Vitals:   03/21/21 2117  TempSrc:   PainSc: 10-Worst pain ever                 Earl Lites P Thanya Cegielski

## 2021-03-21 NOTE — ED Notes (Signed)
Transition of Care [facility]) - CAGE-AID Screening   Patient Details  Name: Douglas Bishop MRN: [id number] Date of Birth: 1986-07-08   Clinical Narrative:  Pt states he does not need any resources to stop drug or alcohol use.  CAGE-AID Screening:    Have You Ever Felt You Ought to Cut Down on Your Drinking or Drug Use?: No Have People Annoyed You By Critizing Your Drinking Or Drug Use?: No Have You Felt Bad Or Guilty About Your Drinking Or Drug Use?: No Have You Ever Had a Drink or Used Drugs First Thing In The Morning to Steady Your Nerves or to Get Rid of a Hangover?: No CAGE-AID Score: 0  Substance Abuse Education Offered: Yes (pt declined)

## 2021-03-21 NOTE — ED Notes (Signed)
Report given Marge AW. RN for OR. Advised to bring to short stay [facility] at 630 call CRNA at [phone] before he comes

## 2021-03-21 NOTE — Transfer of Care (Signed)
Immediate Anesthesia Transfer of Care Note  Patient: [facility]  Procedure(s) Performed: INTRAMEDULLARY (IM) NAIL FEMORAL (Right Leg Upper)  Patient Location: PACU  Anesthesia Type:General  Level of Consciousness: awake, alert , oriented and patient cooperative  Airway & Oxygen Therapy: Patient Spontanous Breathing and Patient connected to face mask oxygen  Post-op Assessment: Report given to RN and Post -op Vital signs reviewed and stable  Post vital signs: Reviewed and stable  Last Vitals:  Vitals Value Taken Time  BP 144/82 03/21/21 1003  Temp    Pulse 104 03/21/21 1005  Resp 27 03/21/21 1005  SpO2 100 % 03/21/21 1005  Vitals shown include unvalidated device data.  Last Pain:  Vitals:   03/21/21 0722  TempSrc:   PainSc: 10-Worst pain ever      Patients Stated Pain Goal: 2 (03/21/21 1518)  Complications: No complications documented.

## 2021-03-21 NOTE — Progress Notes (Signed)
Orthopedic Tech Progress Note Patient Details:  Douglas Bishop 1986/05/22 [id number] Applied overhead frame to pts bed Patient ID: Kacee Koren, male   DOB: 1985/10/27, 35 y.o.   MRN: [id number]   Gerald Stabs 03/21/2021, 1:28 PM
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
## 2021-03-21 NOTE — Progress Notes (Signed)
Orthopedic Tech Progress Note Patient Details:  Douglas Bishop Aug 27, 1986 [id number]  Musculoskeletal Traction Type of Traction: Bucks Skin Traction Traction Location: rle Traction Weight: 15 lbs   Post Interventions Patient Tolerated: Well Instructions Provided: Care of device,Adjustment of device   [name] Post 03/21/2021, 5:37 AM

## 2021-03-21 NOTE — Progress Notes (Signed)
Attempted report twice, patient is ready to transport. Will attempt to call again. Patient placed on PACU hold at 1104 am. Will continue to monitor.

## 2021-03-22 ENCOUNTER — Encounter (HOSPITAL_COMMUNITY): Payer: Self-pay | Admitting: Orthopedic Surgery

## 2021-03-22 LAB — RAPID URINE DRUG SCREEN, HOSP PERFORMED
Amphetamines: NOT DETECTED
Barbiturates: NOT DETECTED
Benzodiazepines: POSITIVE — AB
Cocaine: NOT DETECTED
Opiates: POSITIVE — AB
Tetrahydrocannabinol: POSITIVE — AB

## 2021-03-22 LAB — CBC
HCT: 32.9 % — ABNORMAL LOW (ref 39.0–52.0)
Hemoglobin: 11.1 g/dL — ABNORMAL LOW (ref 13.0–17.0)
MCH: 30.5 pg (ref 26.0–34.0)
MCHC: 33.7 g/dL (ref 30.0–36.0)
MCV: 90.4 fL (ref 80.0–100.0)
Platelets: 163 10*3/uL (ref 150–400)
RBC: 3.64 MIL/uL — ABNORMAL LOW (ref 4.22–5.81)
RDW: 14.4 % (ref 11.5–15.5)
WBC: 12.1 10*3/uL — ABNORMAL HIGH (ref 4.0–10.5)
nRBC: 0 % (ref 0.0–0.2)

## 2021-03-22 LAB — URINE CULTURE

## 2021-03-22 LAB — BASIC METABOLIC PANEL
Anion gap: 7 (ref 5–15)
BUN: 12 mg/dL (ref 6–20)
CO2: 26 mmol/L (ref 22–32)
Calcium: 8.1 mg/dL — ABNORMAL LOW (ref 8.9–10.3)
Chloride: 99 mmol/L (ref 98–111)
Creatinine, Ser: 1.15 mg/dL (ref 0.61–1.24)
GFR, Estimated: >60 mL/min (ref >60)
Glucose, Bld: 100 mg/dL — ABNORMAL HIGH (ref 70–99)
Potassium: 3.9 mmol/L (ref 3.5–5.1)
Sodium: 132 mmol/L — ABNORMAL LOW (ref 135–145)

## 2021-03-22 MED ORDER — CEFAZOLIN SODIUM-DEXTROSE 2-4 GM/100ML-% IV SOLN
2.0000 g | INTRAVENOUS | Status: AC
  Administered 2021-03-23: 2 g via INTRAVENOUS
  Filled 2021-03-22: qty 100

## 2021-03-22 NOTE — Progress Notes (Signed)
Orthopedic Tech Progress Note Patient Details:  Douglas Bishop 12/21/1985 [id number]  Ortho Devices Type of Ortho Device: Velcro wrist splint Ortho Device/Splint Location: rue Ortho Device/Splint Interventions: Ordered,Application,Adjustment   Post Interventions Patient Tolerated: Well Instructions Provided: Care of device,Adjustment of device   [name] Post 03/22/2021, 2:27 AM
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
## 2021-03-22 NOTE — Progress Notes (Signed)
  Subjective: Patient is a 35 year old male who presents POD1 s/p right femur intramedullary nail.  Doing well regards to his right femur pain but he does have continued complaints of right knee pain.  Had CT scan last night that demonstrated severely comminuted tibial plateau fracture.  He is currently scheduled for surgery for the tibial plateau fracture by Dr. Carola Frost tomorrow.  Denies any chest pain, shortness of breath, calf pain.  Main complaint is the right knee pain.   Objective: Vital signs in last 24 hours: Temp:  [98 F (36.7 C)-98.5 F (36.9 C)] 98 F (36.7 C) (06/06 1429) Pulse Rate:  [91-102] 102 (06/06 1429) Resp:  [15-19] 17 (06/06 1429) BP: (119-149)/(83-99) 142/98 (06/06 1429) SpO2:  [92 %-100 %] 100 % (06/06 1429)  Intake/Output from previous day: 06/05 0701 - 06/06 0700 In: 1657.2 [P.O.:590; I.V.:967.2; IV Piggyback:100] Out: 200 [Blood:200] Intake/Output this shift: Total I/O In: 120 [P.O.:120] Out: -   Exam:  Ortho exams demonstrate intact dressings with no gross blood or drainage overlying.  Palpable DP pulse bilaterally.  No calf tenderness.  Negative Homans' sign.  Large knee effusion is present.  Tenderness generally and diffusely throughout the right knee.  Able to actively dorsiflex and plantarflex the right ankle.  Labs: Recent Labs    03/21/21 0100 03/22/21 0059  HGB 15.9 11.1*   Recent Labs    03/21/21 0100 03/22/21 0059  WBC 9.2 12.1*  RBC 5.31 3.64*  HCT 48.2 32.9*  PLT 228 163   Recent Labs    03/21/21 0100 03/22/21 0059  NA 136 132*  K 3.1* 3.9  CL 100 99  CO2 25 26  BUN 10 12  CREATININE 1.29* 1.15  GLUCOSE 117* 100*  CALCIUM 8.6* 8.1*   Recent Labs    03/21/21 0100  INR 1.0    Assessment/Plan: Patient is POD 1 s/p right femur intramedullary nail.  Seems that pain is improved in the right femur compared with prior to surgery.  Main complaint is right knee pain and he is currently scheduled for surgery by Dr. Carola Frost  tomorrow.  Plan to continue nonweightbearing status and return to reevaluate patient on Tuesday or Wednesday following Dr. Carola Frost surgery.   Courtland Coppa L Shawnice Tilmon 03/22/2021, 6:15 PM
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
## 2021-03-23 ENCOUNTER — Inpatient Hospital Stay (HOSPITAL_COMMUNITY): Payer: Self-pay

## 2021-03-23 ENCOUNTER — Encounter (HOSPITAL_COMMUNITY)
Admission: EM | Disposition: A | Discharge status: Home / Self Care | Payer: Self-pay | Source: Home / Self Care | Attending: Orthopedic Surgery

## 2021-03-23 ENCOUNTER — Inpatient Hospital Stay (HOSPITAL_COMMUNITY): Payer: Self-pay | Admitting: Anesthesiology

## 2021-03-23 ENCOUNTER — Encounter (HOSPITAL_COMMUNITY): Payer: Self-pay | Admitting: Orthopedic Surgery

## 2021-03-23 HISTORY — PX: ORIF TIBIA PLATEAU: SHX2132

## 2021-03-23 LAB — CBC
HCT: 29.6 % — ABNORMAL LOW (ref 39.0–52.0)
Hemoglobin: 9.7 g/dL — ABNORMAL LOW (ref 13.0–17.0)
MCH: 30 pg (ref 26.0–34.0)
MCHC: 32.8 g/dL (ref 30.0–36.0)
MCV: 91.6 fL (ref 80.0–100.0)
Platelets: 150 10*3/uL (ref 150–400)
RBC: 3.23 MIL/uL — ABNORMAL LOW (ref 4.22–5.81)
RDW: 14.4 % (ref 11.5–15.5)
WBC: 10.4 10*3/uL (ref 4.0–10.5)
nRBC: 0 % (ref 0.0–0.2)

## 2021-03-23 LAB — BASIC METABOLIC PANEL
Anion gap: 6 (ref 5–15)
BUN: 12 mg/dL (ref 6–20)
CO2: 28 mmol/L (ref 22–32)
Calcium: 8.2 mg/dL — ABNORMAL LOW (ref 8.9–10.3)
Chloride: 98 mmol/L (ref 98–111)
Creatinine, Ser: 1.11 mg/dL (ref 0.61–1.24)
GFR, Estimated: >60 mL/min (ref >60)
Glucose, Bld: 96 mg/dL (ref 70–99)
Potassium: 3.8 mmol/L (ref 3.5–5.1)
Sodium: 132 mmol/L — ABNORMAL LOW (ref 135–145)

## 2021-03-23 LAB — HEMOGLOBIN AND HEMATOCRIT, BLOOD
HCT: 25.9 % — ABNORMAL LOW (ref 39.0–52.0)
Hemoglobin: 8.6 g/dL — ABNORMAL LOW (ref 13.0–17.0)

## 2021-03-23 SURGERY — OPEN REDUCTION INTERNAL FIXATION (ORIF) TIBIAL PLATEAU
Anesthesia: General | Laterality: Right

## 2021-03-23 MED ORDER — PROMETHAZINE HCL 25 MG/ML IJ SOLN: 6.2500 mg | INTRAMUSCULAR | Status: DC | PRN

## 2021-03-23 MED ORDER — FENTANYL CITRATE (PF) 250 MCG/5ML IJ SOLN
INTRAMUSCULAR | Status: AC
  Filled 2021-03-23: qty 5

## 2021-03-23 MED ORDER — ROCURONIUM BROMIDE 10 MG/ML (PF) SYRINGE
PREFILLED_SYRINGE | INTRAVENOUS | Status: DC | PRN
  Administered 2021-03-23: 40 mg via INTRAVENOUS
  Administered 2021-03-23: 20 mg via INTRAVENOUS
  Administered 2021-03-23: 30 mg via INTRAVENOUS
  Administered 2021-03-23: 60 mg via INTRAVENOUS

## 2021-03-23 MED ORDER — PHENYLEPHRINE HCL-NACL 10-0.9 MG/250ML-% IV SOLN
INTRAVENOUS | Status: AC
  Filled 2021-03-23: qty 750

## 2021-03-23 MED ORDER — PROPOFOL 10 MG/ML IV BOLUS
INTRAVENOUS | Status: DC | PRN
  Administered 2021-03-23: 200 mg via INTRAVENOUS

## 2021-03-23 MED ORDER — ESMOLOL HCL 100 MG/10ML IV SOLN
INTRAVENOUS | Status: DC | PRN
  Administered 2021-03-23: 40 mg via INTRAVENOUS

## 2021-03-23 MED ORDER — METOPROLOL TARTRATE 5 MG/5ML IV SOLN
INTRAVENOUS | Status: DC | PRN
  Administered 2021-03-23: 1 mg via INTRAVENOUS

## 2021-03-23 MED ORDER — ACETAMINOPHEN 500 MG PO TABS: 1000.0000 mg | ORAL_TABLET | Freq: Once | ORAL | Status: DC

## 2021-03-23 MED ORDER — FENTANYL CITRATE (PF) 100 MCG/2ML IJ SOLN
INTRAMUSCULAR | Status: DC | PRN
  Administered 2021-03-23: 25 ug via INTRAVENOUS
  Administered 2021-03-23: 50 ug via INTRAVENOUS
  Administered 2021-03-23: 150 ug via INTRAVENOUS

## 2021-03-23 MED ORDER — CHLORHEXIDINE GLUCONATE 0.12 % MT SOLN
OROMUCOSAL | Status: AC
  Administered 2021-03-23: 15 mL via OROMUCOSAL
  Filled 2021-03-23: qty 15

## 2021-03-23 MED ORDER — MIDAZOLAM HCL 2 MG/2ML IJ SOLN
INTRAMUSCULAR | Status: AC
  Filled 2021-03-23: qty 2

## 2021-03-23 MED ORDER — ENOXAPARIN SODIUM 40 MG/0.4ML IJ SOSY
40.0000 mg | PREFILLED_SYRINGE | INTRAMUSCULAR | Status: DC
  Administered 2021-03-23: 40 mg via SUBCUTANEOUS
  Filled 2021-03-23: qty 0.4

## 2021-03-23 MED ORDER — SUGAMMADEX SODIUM 200 MG/2ML IV SOLN
INTRAVENOUS | Status: DC | PRN
  Administered 2021-03-23: 200 mg via INTRAVENOUS

## 2021-03-23 MED ORDER — ONDANSETRON HCL 4 MG/2ML IJ SOLN
INTRAMUSCULAR | Status: DC | PRN
  Administered 2021-03-23: 4 mg via INTRAVENOUS

## 2021-03-23 MED ORDER — FENTANYL CITRATE (PF) 100 MCG/2ML IJ SOLN
INTRAMUSCULAR | Status: AC
  Filled 2021-03-23: qty 2

## 2021-03-23 MED ORDER — MIDAZOLAM HCL 5 MG/5ML IJ SOLN
INTRAMUSCULAR | Status: DC | PRN
  Administered 2021-03-23: 2 mg via INTRAVENOUS

## 2021-03-23 MED ORDER — ONDANSETRON HCL 4 MG/2ML IJ SOLN
INTRAMUSCULAR | Status: AC
  Filled 2021-03-23: qty 2

## 2021-03-23 MED ORDER — DEXMEDETOMIDINE (PRECEDEX) IN NS 20 MCG/5ML (4 MCG/ML) IV SYRINGE
PREFILLED_SYRINGE | INTRAVENOUS | Status: DC | PRN
  Administered 2021-03-23: 12 ug via INTRAVENOUS
  Administered 2021-03-23: 8 ug via INTRAVENOUS

## 2021-03-23 MED ORDER — CHLORHEXIDINE GLUCONATE 0.12 % MT SOLN: 15.0000 mL | Freq: Once | OROMUCOSAL | Status: AC

## 2021-03-23 MED ORDER — DEXAMETHASONE SODIUM PHOSPHATE 10 MG/ML IJ SOLN
INTRAMUSCULAR | Status: AC
  Filled 2021-03-23: qty 1

## 2021-03-23 MED ORDER — OXYCODONE HCL 5 MG PO TABS
ORAL_TABLET | ORAL | Status: AC
  Filled 2021-03-23: qty 1

## 2021-03-23 MED ORDER — ACETAMINOPHEN 10 MG/ML IV SOLN
INTRAVENOUS | Status: DC | PRN
  Administered 2021-03-23: 1000 mg via INTRAVENOUS

## 2021-03-23 MED ORDER — LACTATED RINGERS IV SOLN: INTRAVENOUS | Status: DC

## 2021-03-23 MED ORDER — METHOCARBAMOL 500 MG PO TABS
ORAL_TABLET | ORAL | Status: AC
  Filled 2021-03-23: qty 1

## 2021-03-23 MED ORDER — ALBUMIN HUMAN 5 % IV SOLN: INTRAVENOUS | Status: DC | PRN

## 2021-03-23 MED ORDER — 0.9 % SODIUM CHLORIDE (POUR BTL) OPTIME
TOPICAL | Status: DC | PRN
  Administered 2021-03-23: 1000 mL

## 2021-03-23 MED ORDER — FENTANYL CITRATE (PF) 100 MCG/2ML IJ SOLN
25.0000 ug | INTRAMUSCULAR | Status: DC | PRN
  Administered 2021-03-23: 50 ug via INTRAVENOUS

## 2021-03-23 MED ORDER — ORAL CARE MOUTH RINSE: 15.0000 mL | Freq: Once | OROMUCOSAL | Status: AC

## 2021-03-23 MED ORDER — PROPOFOL 10 MG/ML IV BOLUS
INTRAVENOUS | Status: AC
  Filled 2021-03-23: qty 20

## 2021-03-23 MED ORDER — LIDOCAINE 2% (20 MG/ML) 5 ML SYRINGE
INTRAMUSCULAR | Status: DC | PRN
  Administered 2021-03-23: 60 mg via INTRAVENOUS

## 2021-03-23 MED ORDER — DEXAMETHASONE SODIUM PHOSPHATE 10 MG/ML IJ SOLN
INTRAMUSCULAR | Status: DC | PRN
  Administered 2021-03-23: 10 mg via INTRAVENOUS

## 2021-03-23 MED ORDER — POTASSIUM CHLORIDE IN NACL 20-0.9 MEQ/L-% IV SOLN
INTRAVENOUS | Status: DC
  Administered 2021-03-25: 600 mL via INTRAVENOUS
  Filled 2021-03-23 (×10): qty 1000

## 2021-03-23 SURGICAL SUPPLY — 92 items
BANDAGE ESMARK 6X9 LF (GAUZE/BANDAGES/DRESSINGS) ×1 IMPLANT
BIT DRILL 100X2.5XANTM LCK (BIT) ×1 IMPLANT
BIT DRILL CAL (BIT) ×1 IMPLANT
BIT DRL 100X2.5XANTM LCK (BIT) ×1
BLADE CLIPPER SURG (BLADE) IMPLANT
BLADE SURG 10 STRL SS (BLADE) ×2 IMPLANT
BLADE SURG 15 STRL LF DISP TIS (BLADE) ×1 IMPLANT
BLADE SURG 15 STRL SS (BLADE) ×1
BNDG COHESIVE 4X5 TAN STRL (GAUZE/BANDAGES/DRESSINGS) ×2 IMPLANT
BNDG ELASTIC 4X5.8 VLCR STR LF (GAUZE/BANDAGES/DRESSINGS) ×2 IMPLANT
BNDG ELASTIC 6X10 VLCR STRL LF (GAUZE/BANDAGES/DRESSINGS) ×2 IMPLANT
BNDG ELASTIC 6X5.8 VLCR STR LF (GAUZE/BANDAGES/DRESSINGS) ×2 IMPLANT
BNDG ESMARK 6X9 LF (GAUZE/BANDAGES/DRESSINGS) ×2
BNDG GAUZE ELAST 4 BULKY (GAUZE/BANDAGES/DRESSINGS) ×2 IMPLANT
BONE CANC CHIPS 40CC CAN1/2 (Bone Implant) ×2 IMPLANT
BRUSH SCRUB EZ PLAIN DRY (MISCELLANEOUS) ×4 IMPLANT
CANISTER SUCT 3000ML PPV (MISCELLANEOUS) ×2 IMPLANT
CHIPS CANC BONE 40CC CAN1/2 (Bone Implant) ×1 IMPLANT
COVER SURGICAL LIGHT HANDLE (MISCELLANEOUS) ×2 IMPLANT
COVER WAND RF STERILE (DRAPES) ×2 IMPLANT
CUFF TOURN SGL QUICK 34 (TOURNIQUET CUFF) ×1
CUFF TRNQT CYL 34X4.125X (TOURNIQUET CUFF) ×1 IMPLANT
DRAPE C-ARM 42X72 X-RAY (DRAPES) ×2 IMPLANT
DRAPE C-ARMOR (DRAPES) ×2 IMPLANT
DRAPE HALF SHEET 40X57 (DRAPES) IMPLANT
DRAPE INCISE IOBAN 66X45 STRL (DRAPES) ×2 IMPLANT
DRAPE U-SHAPE 47X51 STRL (DRAPES) ×2 IMPLANT
DRILL BIT 2.5MM (BIT) ×1
DRILL BIT CAL (BIT) ×2
DRSG ADAPTIC 3X8 NADH LF (GAUZE/BANDAGES/DRESSINGS) ×2 IMPLANT
DRSG PAD ABDOMINAL 8X10 ST (GAUZE/BANDAGES/DRESSINGS) ×4 IMPLANT
ELECT REM PT RETURN 9FT ADLT (ELECTROSURGICAL) ×2
ELECTRODE REM PT RTRN 9FT ADLT (ELECTROSURGICAL) ×1 IMPLANT
GAUZE SPONGE 2X2 8PLY STRL LF (GAUZE/BANDAGES/DRESSINGS) ×1 IMPLANT
GAUZE SPONGE 4X4 12PLY STRL (GAUZE/BANDAGES/DRESSINGS) ×2 IMPLANT
GLOVE BIO SURGEON STRL SZ7.5 (GLOVE) ×2 IMPLANT
GLOVE BIO SURGEON STRL SZ8 (GLOVE) ×2 IMPLANT
GLOVE BIOGEL PI IND STRL 7.5 (GLOVE) ×1 IMPLANT
GLOVE BIOGEL PI INDICATOR 7.5 (GLOVE) ×1
GLOVE SRG 8 PF TXTR STRL LF DI (GLOVE) ×1 IMPLANT
GLOVE SURG UNDER POLY LF SZ8 (GLOVE) ×1
GOWN STRL REUS W/ TWL LRG LVL3 (GOWN DISPOSABLE) ×2 IMPLANT
GOWN STRL REUS W/ TWL XL LVL3 (GOWN DISPOSABLE) ×1 IMPLANT
GOWN STRL REUS W/TWL LRG LVL3 (GOWN DISPOSABLE) ×2
GOWN STRL REUS W/TWL XL LVL3 (GOWN DISPOSABLE) ×1
IMMOBILIZER KNEE 20 (SOFTGOODS) ×2
IMMOBILIZER KNEE 20 THIGH 36 (SOFTGOODS) ×1 IMPLANT
IMMOBILIZER KNEE 22 UNIV (SOFTGOODS) ×2 IMPLANT
K-WIRE ACE 1.6X6 (WIRE) ×10
KIT BASIN OR (CUSTOM PROCEDURE TRAY) ×2 IMPLANT
KIT TURNOVER KIT B (KITS) ×2 IMPLANT
KWIRE ACE 1.6X6 (WIRE) ×5 IMPLANT
NDL SUT 6 .5 CRC .975X.05 MAYO (NEEDLE) IMPLANT
NEEDLE MAYO TAPER (NEEDLE)
NS IRRIG 1000ML POUR BTL (IV SOLUTION) ×2 IMPLANT
PACK ORTHO EXTREMITY (CUSTOM PROCEDURE TRAY) ×2 IMPLANT
PAD ABD 8X10 STRL (GAUZE/BANDAGES/DRESSINGS) ×2 IMPLANT
PAD ARMBOARD 7.5X6 YLW CONV (MISCELLANEOUS) ×4 IMPLANT
PAD CAST 4YDX4 CTTN HI CHSV (CAST SUPPLIES) ×1 IMPLANT
PADDING CAST COTTON 4X4 STRL (CAST SUPPLIES) ×1
PADDING CAST COTTON 6X4 STRL (CAST SUPPLIES) ×2 IMPLANT
PLATE LOCK 5H STD RT PROX TIB (Plate) ×2 IMPLANT
SCREW LOCK 3.5X70 DIST TIB (Screw) ×2 IMPLANT
SCREW LOCK 3.5X80 DIST TIB (Screw) ×4 IMPLANT
SCREW LOCK CORT STAR 3.5X70 (Screw) ×2 IMPLANT
SCREW LOCK CORT STAR 3.5X80 (Screw) ×4 IMPLANT
SCREW LOCK CORT STAR 3.5X85 (Screw) ×2 IMPLANT
SCREW LOW PROF CORTICAL 3.5X80 (Screw) ×2 IMPLANT
SCREW LP 3.5X85MM (Screw) ×2 IMPLANT
SCREW T15 LP CORT 3.5X36MM NS (Screw) ×2 IMPLANT
SCREW T15 LP CORT 3.5X40MM NS (Screw) ×2 IMPLANT
SCREW T15 LP CORT 3.5X42MM NS (Screw) ×4 IMPLANT
SPONGE GAUZE 2X2 STER 10/PKG (GAUZE/BANDAGES/DRESSINGS) ×1
SPONGE LAP 18X18 RF (DISPOSABLE) ×2 IMPLANT
STAPLER VISISTAT 35W (STAPLE) ×2 IMPLANT
STOCKINETTE IMPERVIOUS LG (DRAPES) ×2 IMPLANT
SUCTION FRAZIER HANDLE 10FR (MISCELLANEOUS) ×1
SUCTION TUBE FRAZIER 10FR DISP (MISCELLANEOUS) ×1 IMPLANT
SUT ETHILON 3 0 PS 1 (SUTURE) IMPLANT
SUT PROLENE 0 CT 2 (SUTURE) ×4 IMPLANT
SUT VIC AB 0 CT1 27 (SUTURE) ×1
SUT VIC AB 0 CT1 27XBRD ANBCTR (SUTURE) ×1 IMPLANT
SUT VIC AB 1 CT1 27 (SUTURE) ×1
SUT VIC AB 1 CT1 27XBRD ANBCTR (SUTURE) ×1 IMPLANT
SUT VIC AB 2-0 CT1 27 (SUTURE) ×2
SUT VIC AB 2-0 CT1 TAPERPNT 27 (SUTURE) ×2 IMPLANT
TOWEL GREEN STERILE (TOWEL DISPOSABLE) ×4 IMPLANT
TOWEL GREEN STERILE FF (TOWEL DISPOSABLE) ×2 IMPLANT
TRAY FOLEY MTR SLVR 16FR STAT (SET/KITS/TRAYS/PACK) IMPLANT
TUBE CONNECTING 12X1/4 (SUCTIONS) ×2 IMPLANT
WATER STERILE IRR 1000ML POUR (IV SOLUTION) ×4 IMPLANT
YANKAUER SUCT BULB TIP NO VENT (SUCTIONS) ×2 IMPLANT

## 2021-03-23 NOTE — Progress Notes (Signed)
Notified Dr. Sampson Goon of SPO2 92% and elevated HR from 110 up to 140 (not sustained) while patient was sleeping. He was already aware and said the patient was doing this when he saw him as well.  No interventions ordered.

## 2021-03-23 NOTE — Anesthesia Preprocedure Evaluation (Signed)
Anesthesia Evaluation  Patient identified by MRN, date of birth, ID band Patient awake    Reviewed: Allergy & Precautions, NPO status , Patient's Chart, lab work & pertinent test results  Airway Mallampati: II  TM Distance: >3 FB Neck ROM: Full    Dental  (+) Dental Advisory Given   Pulmonary asthma , sleep apnea , Current Smoker and Patient abstained from smoking.,    breath sounds clear to auscultation       Cardiovascular negative cardio ROS   Rhythm:Regular Rate:Normal     Neuro/Psych negative neurological ROS     GI/Hepatic negative GI ROS, Neg liver ROS,   Endo/Other  Morbid obesity  Renal/GU negative Renal ROS     Musculoskeletal   Abdominal   Peds  Hematology  (+) anemia ,   Anesthesia Other Findings   Reproductive/Obstetrics                             Lab Results  Component Value Date   WBC 10.4 03/23/2021   HGB 9.7 (L) 03/23/2021   HCT 29.6 (L) 03/23/2021   MCV 91.6 03/23/2021   PLT 150 03/23/2021   Lab Results  Component Value Date   CREATININE 1.11 03/23/2021   BUN 12 03/23/2021   NA 132 (L) 03/23/2021   K 3.8 03/23/2021   CL 98 03/23/2021   CO2 28 03/23/2021    Anesthesia Physical Anesthesia Plan  ASA: III  Anesthesia Plan: General   Post-op Pain Management:    Induction: Intravenous  PONV Risk Score and Plan: 1 and Dexamethasone, Ondansetron and Treatment may vary due to age or medical condition  Airway Management Planned: Oral ETT  Additional Equipment:   Intra-op Plan:   Post-operative Plan: Extubation in OR  Informed Consent: I have reviewed the patients History and Physical, chart, labs and discussed the procedure including the risks, benefits and alternatives for the proposed anesthesia with the patient or authorized representative who has indicated his/her understanding and acceptance.     Dental advisory given  Plan Discussed with:  CRNA  Anesthesia Plan Comments:         Anesthesia Quick Evaluation

## 2021-03-23 NOTE — Progress Notes (Signed)
Right foot perfused and sensate Ankle dorsiflexion intact No pain with passive range of motion of the foot on the right-hand side Plan at this time is mobilization as tolerated but patient will likely not be able to use crutches due to the right wrist fracture Anticipate he may be more of a wheelchair type person until orthopedic trauma service allows weightbearing on the right leg.  Due to the tibial plateau fracture

## 2021-03-23 NOTE — Anesthesia Procedure Notes (Signed)
Procedure Name: Intubation Date/Time: 03/23/2021 12:36 PM Performed by: Lovie Chol, CRNA Pre-anesthesia Checklist: Patient identified, Emergency Drugs available, Suction available and Patient being monitored Patient Re-evaluated:Patient Re-evaluated prior to induction Oxygen Delivery Method: Circle system utilized Preoxygenation: Pre-oxygenation with 100% oxygen Induction Type: IV induction Ventilation: Two handed mask ventilation required and Oral airway inserted - appropriate to patient size Laryngoscope Size: Glidescope and 4 Grade View: Grade I Tube type: Oral Tube size: 7.5 mm Number of attempts: 1 Airway Equipment and Method: Oral airway,  Video-laryngoscopy and Rigid stylet Placement Confirmation: ETT inserted through vocal cords under direct vision,  positive ETCO2 and breath sounds checked- equal and bilateral Secured at: 23 cm Tube secured with: Tape Dental Injury: Teeth and Oropharynx as per pre-operative assessment
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
## 2021-03-23 NOTE — Progress Notes (Signed)
ANTICOAGULATION CONSULT NOTE - Initial Consult  Pharmacy Consult for Lovenox Indication: VTE prophylaxis  No Known Allergies  Patient Measurements: Height: 5\' 7"  (170.2 cm) Weight: 113.4 kg (250 lb) IBW/kg (Calculated) : 66.1  Labs: Recent Labs    03/21/21 0100 03/22/21 0059 03/23/21 0123  HGB 15.9 11.1* 9.7*  HCT 48.2 32.9* 29.6*  PLT 228 163 150  LABPROT 13.5  --   --   INR 1.0  --   --   CREATININE 1.29* 1.15 1.11    Estimated Creatinine Clearance: 109.5 mL/min (by C-G formula based on SCr of 1.11 mg/dL).   Medical History: Past Medical History:  Diagnosis Date  . Asthma   . Sleep apnea     Assessment: 35 yo male - pharmacy consulted for Lovenox for VTE prophylaxis.  Goal of Therapy:  Monitor platelets by anticoagulation protocol: Yes   Plan:  Lovenox 40 mg sq daily. Pharmacy will sign off.  30, Reece Leader, BCCP Clinical Pharmacist  03/23/2021 7:18 PM   [facility] pharmacy phone numbers are listed on [email or web address]

## 2021-03-23 NOTE — Anesthesia Postprocedure Evaluation (Signed)
Anesthesia Post Note  Patient: [name]  Procedure(s) Performed: OPEN REDUCTION INTERNAL FIXATION (ORIF) TIBIAL PLATEAU (Right )     Patient location during evaluation: PACU Anesthesia Type: General Level of consciousness: awake and alert Pain management: pain level controlled Vital Signs Assessment: post-procedure vital signs reviewed and stable Respiratory status: spontaneous breathing, nonlabored ventilation and respiratory function stable Cardiovascular status: blood pressure returned to baseline and stable Postop Assessment: no apparent nausea or vomiting Anesthetic complications: no   No complications documented.  Last Vitals:  Vitals:   03/23/21 1612 03/23/21 1626  BP:  (!) 156/98  Pulse: 100 95  Resp: 19 18  Temp:  36.6 C  SpO2: 97% 98%    Last Pain:  Vitals:   03/23/21 1626  TempSrc: Oral  PainSc:                  Douglas Bishop

## 2021-03-23 NOTE — Transfer of Care (Signed)
Immediate Anesthesia Transfer of Care Note  Patient: Douglas Bishop  Procedure(s) Performed: OPEN REDUCTION INTERNAL FIXATION (ORIF) TIBIAL PLATEAU (Right )  Patient Location: PACU  Anesthesia Type:General  Level of Consciousness: drowsy  Airway & Oxygen Therapy: Patient Spontanous Breathing and Patient connected to face mask oxygen  Post-op Assessment: Report given to RN, Post -op Vital signs reviewed and stable and Patient moving all extremities X 4  Post vital signs: Reviewed and stable  Last Vitals:  Vitals Value Taken Time  BP 132/84 03/23/21 1535  Temp    Pulse 111 03/23/21 1540  Resp 23 03/23/21 1540  SpO2 100 % 03/23/21 1540  Vitals shown include unvalidated device data.  Last Pain:  Vitals:   03/23/21 0938  TempSrc: Oral  PainSc: 10-Worst pain ever      Patients Stated Pain Goal: 3 (03/23/21 4888)  Complications: No complications documented.

## 2021-03-24 ENCOUNTER — Encounter (HOSPITAL_COMMUNITY): Payer: Self-pay | Admitting: Orthopedic Surgery

## 2021-03-24 ENCOUNTER — Inpatient Hospital Stay (HOSPITAL_COMMUNITY): Payer: Self-pay

## 2021-03-24 DIAGNOSIS — F1290 Cannabis use, unspecified, uncomplicated: Secondary | ICD-10-CM

## 2021-03-24 DIAGNOSIS — F17200 Nicotine dependence, unspecified, uncomplicated: Secondary | ICD-10-CM

## 2021-03-24 DIAGNOSIS — S7291XA Unspecified fracture of right femur, initial encounter for closed fracture: Secondary | ICD-10-CM

## 2021-03-24 DIAGNOSIS — S82141A Displaced bicondylar fracture of right tibia, initial encounter for closed fracture: Secondary | ICD-10-CM

## 2021-03-24 DIAGNOSIS — S52501A Unspecified fracture of the lower end of right radius, initial encounter for closed fracture: Secondary | ICD-10-CM

## 2021-03-24 HISTORY — DX: Displaced bicondylar fracture of right tibia, initial encounter for closed fracture: S82.141A

## 2021-03-24 HISTORY — DX: Nicotine dependence, unspecified, uncomplicated: F17.200

## 2021-03-24 HISTORY — DX: Unspecified fracture of the lower end of right radius, initial encounter for closed fracture: S52.501A

## 2021-03-24 HISTORY — DX: Cannabis use, unspecified, uncomplicated: F12.90

## 2021-03-24 HISTORY — DX: Unspecified fracture of right femur, initial encounter for closed fracture: S72.91XA

## 2021-03-24 LAB — COMPREHENSIVE METABOLIC PANEL
ALT: 16 U/L (ref 0–44)
AST: 25 U/L (ref 15–41)
Albumin: 3.1 g/dL — ABNORMAL LOW (ref 3.5–5.0)
Alkaline Phosphatase: 33 U/L — ABNORMAL LOW (ref 38–126)
Anion gap: 7 (ref 5–15)
BUN: 14 mg/dL (ref 6–20)
CO2: 28 mmol/L (ref 22–32)
Calcium: 8.3 mg/dL — ABNORMAL LOW (ref 8.9–10.3)
Chloride: 99 mmol/L (ref 98–111)
Creatinine, Ser: 1 mg/dL (ref 0.61–1.24)
GFR, Estimated: >60 mL/min (ref >60)
Glucose, Bld: 123 mg/dL — ABNORMAL HIGH (ref 70–99)
Potassium: 4.5 mmol/L (ref 3.5–5.1)
Sodium: 134 mmol/L — ABNORMAL LOW (ref 135–145)
Total Bilirubin: 0.9 mg/dL (ref 0.3–1.2)
Total Protein: 5.9 g/dL — ABNORMAL LOW (ref 6.5–8.1)

## 2021-03-24 LAB — CBC
HCT: 24.4 % — ABNORMAL LOW (ref 39.0–52.0)
Hemoglobin: 8 g/dL — ABNORMAL LOW (ref 13.0–17.0)
MCH: 30.1 pg (ref 26.0–34.0)
MCHC: 32.8 g/dL (ref 30.0–36.0)
MCV: 91.7 fL (ref 80.0–100.0)
Platelets: 146 10*3/uL — ABNORMAL LOW (ref 150–400)
RBC: 2.66 MIL/uL — ABNORMAL LOW (ref 4.22–5.81)
RDW: 14.1 % (ref 11.5–15.5)
WBC: 11 10*3/uL — ABNORMAL HIGH (ref 4.0–10.5)
nRBC: 0 % (ref 0.0–0.2)

## 2021-03-24 LAB — VITAMIN D 25 HYDROXY (VIT D DEFICIENCY, FRACTURES): Vit D, 25-Hydroxy: 8.14 ng/mL — ABNORMAL LOW (ref 30–100)

## 2021-03-24 MED ORDER — ENOXAPARIN SODIUM 60 MG/0.6ML IJ SOSY
50.0000 mg | PREFILLED_SYRINGE | INTRAMUSCULAR | Status: DC
  Administered 2021-03-24 – 2021-03-26 (×3): 50 mg via SUBCUTANEOUS
  Filled 2021-03-24 (×3): qty 0.6

## 2021-03-24 NOTE — Progress Notes (Signed)
Orthopedic Tech Progress Note Patient Details:  Douglas Bishop 1986/07/30 [id number] Called in order to [facility] for a ROM BRACE UNLOCKED Patient ID: [name], male   DOB: [birth year], 35 y.o.   MRN: [id number]   Donald Pore 03/24/2021, 8:16 AM
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
## 2021-03-25 ENCOUNTER — Inpatient Hospital Stay (HOSPITAL_COMMUNITY): Payer: Self-pay

## 2021-03-25 LAB — HEMOGLOBIN AND HEMATOCRIT, BLOOD
HCT: 26.4 % — ABNORMAL LOW (ref 39.0–52.0)
Hemoglobin: 8.8 g/dL — ABNORMAL LOW (ref 13.0–17.0)

## 2021-03-25 LAB — URINALYSIS, COMPLETE (UACMP) WITH MICROSCOPIC
Bilirubin Urine: NEGATIVE
Glucose, UA: NEGATIVE mg/dL
Ketones, ur: NEGATIVE mg/dL
Leukocytes,Ua: NEGATIVE
Nitrite: NEGATIVE
Protein, ur: NEGATIVE mg/dL
Specific Gravity, Urine: 1.01 (ref 1.005–1.030)
pH: 7 (ref 5.0–8.0)

## 2021-03-25 LAB — CBC
HCT: 21.2 % — ABNORMAL LOW (ref 39.0–52.0)
Hemoglobin: 6.9 g/dL — CL (ref 13.0–17.0)
MCH: 29.9 pg (ref 26.0–34.0)
MCHC: 32.5 g/dL (ref 30.0–36.0)
MCV: 91.8 fL (ref 80.0–100.0)
Platelets: 180 10*3/uL (ref 150–400)
RBC: 2.31 MIL/uL — ABNORMAL LOW (ref 4.22–5.81)
RDW: 14.6 % (ref 11.5–15.5)
WBC: 7.4 10*3/uL (ref 4.0–10.5)
nRBC: 0.7 % — ABNORMAL HIGH (ref 0.0–0.2)

## 2021-03-25 LAB — LACTIC ACID, PLASMA: Lactic Acid, Venous: 0.7 mmol/L (ref 0.5–1.9)

## 2021-03-25 LAB — ABO/RH: ABO/RH(D): O POS

## 2021-03-25 LAB — PREPARE RBC (CROSSMATCH)

## 2021-03-25 MED ORDER — DIPHENHYDRAMINE HCL 25 MG PO CAPS
50.0000 mg | ORAL_CAPSULE | Freq: Once | ORAL | Status: AC
  Administered 2021-03-25: 50 mg via ORAL
  Filled 2021-03-25: qty 2

## 2021-03-25 MED ORDER — ACETAMINOPHEN 10 MG/ML IV SOLN
1000.0000 mg | Freq: Four times a day (QID) | INTRAVENOUS | Status: AC
  Administered 2021-03-25 – 2021-03-26 (×4): 1000 mg via INTRAVENOUS
  Filled 2021-03-25 (×4): qty 100

## 2021-03-25 MED ORDER — SODIUM CHLORIDE 0.9% IV SOLUTION: Freq: Once | INTRAVENOUS | Status: AC

## 2021-03-25 MED ORDER — SODIUM CHLORIDE 0.9 % IV SOLN: Freq: Once | INTRAVENOUS | Status: AC

## 2021-03-25 MED ORDER — METOPROLOL TARTRATE 50 MG PO TABS
ORAL_TABLET | ORAL | Status: AC
  Filled 2021-03-25: qty 1

## 2021-03-25 MED ORDER — ASCORBIC ACID 500 MG PO TABS
1000.0000 mg | ORAL_TABLET | Freq: Every day | ORAL | Status: DC
  Administered 2021-03-26 – 2021-03-27 (×2): 1000 mg via ORAL
  Filled 2021-03-25 (×2): qty 2

## 2021-03-25 MED ORDER — IOHEXOL 350 MG/ML SOLN
150.0000 mL | Freq: Once | INTRAVENOUS | Status: AC | PRN
  Administered 2021-03-25: 150 mL via INTRAVENOUS

## 2021-03-25 MED ORDER — ATENOLOL 25 MG PO TABS
12.5000 mg | ORAL_TABLET | Freq: Every day | ORAL | Status: DC
  Administered 2021-03-25 – 2021-03-27 (×3): 12.5 mg via ORAL
  Filled 2021-03-25 (×3): qty 0.5

## 2021-03-25 MED ORDER — VITAMIN D 25 MCG (1000 UNIT) PO TABS
2000.0000 [IU] | ORAL_TABLET | Freq: Two times a day (BID) | ORAL | Status: DC
  Administered 2021-03-25 – 2021-03-27 (×4): 2000 [IU] via ORAL
  Filled 2021-03-25 (×4): qty 2

## 2021-03-25 NOTE — Progress Notes (Signed)
PT Cancellation Note  Patient Details Name: Douglas Bishop MRN: [id number] DOB: 11/29/1985   Cancelled Treatment:    Reason Eval/Treat Not Completed: Medical issues which prohibited therapy (Pt to transfer to PCU after elevated temperature post 2nd unit of blood, will defer PT at this time and follow up per POC.)   Leanne Sisler Artis Delay 03/25/2021, 3:14 PM  Bonney Leitz , PTA Acute Rehabilitation Services Pager [phone] Office [phone]

## 2021-03-25 NOTE — Progress Notes (Signed)
2nd unit of blood hung, verified with Inetta Fermo, Charity fundraiser. Pre-vitals taken and charted per protocol. Patient denies chills/n/v during first 15 minutes of infusion. Physician notified about potential transfer to other unit.

## 2021-03-25 NOTE — Progress Notes (Signed)
After 1hr of second unit of blood, patient's temp spiked to 101.8, HR 122. Physician notified, orders to stop blood transfusion. Transfusion stopped. IV ofirmev and po benadry given. Vitals rechecked and temp down to 99.5, remains tachy and hypertensive. Patient complains of sweating. Blood bagged with copy of green sheet and UA sample to be sent back to blood bank for further reaction testing. Elaina, NT taking blood back down at 1200. Patient to be transferred to 5W for progressive care, report called 2x but unit said they are unable to take report and will call back. Patient still appears diaphoretic.

## 2021-03-25 NOTE — Progress Notes (Signed)
   03/25/21 0445  Assess: MEWS Score  Temp (!) 101.5 F (38.6 C)  BP 129/69  Pulse Rate (!) 128  Resp 19  SpO2 95 %  O2 Device Room Air  Assess: MEWS Score  MEWS Temp 2  MEWS Systolic 0  MEWS Pulse 2  MEWS RR 0  MEWS LOC 0  MEWS Score 4  MEWS Score Color Red  Assess: if the MEWS score is Yellow or Red  Were vital signs taken at a resting state? Yes  Focused Assessment Change from prior assessment (see assessment flowsheet)  Early Detection of Sepsis Score *See Row Information* Low  MEWS guidelines implemented *See Row Information* Yes  Treat  MEWS Interventions Escalated (See documentation below)  Take Vital Signs  Increase Vital Sign Frequency  Red: Q 1hr X 4 then Q 4hr X 4, if remains red, continue Q 4hrs  Escalate  MEWS: Escalate Red: discuss with charge nurse/RN and provider, consider discussing with RRT  Notify: Charge Nurse/RN  Name of Charge Nurse/RN Notified Nellie,RN  Date Charge Nurse/RN Notified 03/25/21  Time Charge Nurse/RN Notified [phone]  Notify: Provider  Provider Name/Title Dr. Magnus Ivan  Date Provider Notified 03/25/21  Time Provider Notified [phone]  Notification Type Call  Notification Reason Change in status  Provider response No new orders (to keep monitoring)  Date of Provider Response 03/25/21  Time of Provider Response [phone]  Document  Patient Outcome Not stable and remains on department   Dr. Magnus Ivan notified about increase in Temp and RED MEWS. Stated to continue to close monitor patient. No new orders received.

## 2021-03-25 NOTE — Progress Notes (Addendum)
Date and time results received: 03/25/21 0152  Test: Hemoglobin  Critical Value: 6.9  Name of Provider Notified: Dr. Lyndle Herrlich notified 03/25/21 0152  Orders Received? Or Actions Taken?: Type&Screen;Tranfuse 2 units PRBC
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
## 2021-03-25 NOTE — Progress Notes (Signed)
Dr. Magnus Ivan made aware about V/S 150/77 T100.3 PR128 RR18 95%RA prior blood transfusion. Stated OK to Tranfuse. Patient had no blood transfusion reactions upon receiving blood for first few minutes. Denies SOB/DOB,back pain,chills. Will continue to close moniotr patient.

## 2021-03-25 NOTE — Progress Notes (Signed)
Called about patients heart rate being 130  Patient is in no distress, afebrile, pain meds have been given.    Advised for RN to notify MD and ask for EKG, CBC to evaluate Hgb.
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
## 2021-03-25 NOTE — Progress Notes (Signed)
   03/25/21 0045  Assess: MEWS Score  Temp 98.2 F (36.8 C)  BP 115/68  Pulse Rate (!) 130  Resp 18  Level of Consciousness Alert  SpO2 98 %  O2 Device Room Air  Assess: MEWS Score  MEWS Temp 0  MEWS Systolic 0  MEWS Pulse 3  MEWS RR 0  MEWS LOC 0  MEWS Score 3  MEWS Score Color Yellow  Assess: if the MEWS score is Yellow or Red  Were vital signs taken at a resting state? Yes  Focused Assessment Change from prior assessment (see assessment flowsheet)  Early Detection of Sepsis Score *See Row Information* Low  MEWS guidelines implemented *See Row Information* Yes  Treat  MEWS Interventions Escalated (See documentation below)  Take Vital Signs  Increase Vital Sign Frequency  Yellow: Q 2hr X 2 then Q 4hr X 2, if remains yellow, continue Q 4hrs  Escalate  MEWS: Escalate Yellow: discuss with charge nurse/RN and consider discussing with provider and RRT  Notify: Charge Nurse/RN  Name of Charge Nurse/RN Notified Nellie,RN  Date Charge Nurse/RN Notified 03/25/21  Time Charge Nurse/RN Notified 0045  Notify: Provider  Provider Name/Title Dr. Magnus Ivan  Date Provider Notified 03/25/21  Time Provider Notified [phone]  Notification Type Call  Notification Reason Change in status  Provider response See new orders  Date of Provider Response 03/25/21  Time of Provider Response 0053  Notify: Rapid Response  Name of Rapid Response RN Notified Megan,RN  Date Rapid Response Notified 03/25/21  Time Rapid Response Notified 0051  Document  Patient Outcome Not stable and remains on department

## 2021-03-25 NOTE — Significant Event (Signed)
Rapid Response Event Note   Reason for Call :  Tachycardic in the 130s-160s  Initial Focused Assessment:  Alert and oriented x 4 Patient appears SOB, tachycardic and hot to touch.  BP 114/71 HR 158 T 103 RR 24 O2 100 Lung sounds clear bilaterally Bilateral pedal pulses palpable and equal   Interventions:  Spoke with Dr Magnus Ivan who wanted CTA chest/abd/pelvis, tylenol (which is on [facility] given), 500 cc NS bolus, CBC (already drawn) Helped transport to CT for testing While in CT hgb 6.9 resulted and called Dr Magnus Ivan again for orders  Transfuse 2 units PRBC Type and screen with crossmatch  Plan of Care:  Transfuse blood and continue to monitor patient on 6N   Event Summary:   MD Notified: 0110 Call Time: 0050 Arrival Time: 0100 End Time: 0210  Samuel Bouche, RN

## 2021-03-26 ENCOUNTER — Encounter (HOSPITAL_COMMUNITY): Payer: Self-pay | Admitting: Orthopedic Surgery

## 2021-03-26 ENCOUNTER — Other Ambulatory Visit (HOSPITAL_COMMUNITY): Payer: Self-pay

## 2021-03-26 DIAGNOSIS — E559 Vitamin D deficiency, unspecified: Secondary | ICD-10-CM

## 2021-03-26 HISTORY — DX: Vitamin D deficiency, unspecified: E55.9

## 2021-03-26 LAB — BPAM RBC
Blood Product Expiration Date: 202207102359
Blood Product Expiration Date: 202207102359
ISSUE DATE / TIME: 202206090356
ISSUE DATE / TIME: 202206090759
Unit Type and Rh: 5100
Unit Type and Rh: 5100

## 2021-03-26 LAB — TYPE AND SCREEN
ABO/RH(D): O POS
Antibody Screen: NEGATIVE
Unit division: 0
Unit division: 0

## 2021-03-26 LAB — CBC
HCT: 26.1 % — ABNORMAL LOW (ref 39.0–52.0)
Hemoglobin: 8.5 g/dL — ABNORMAL LOW (ref 13.0–17.0)
MCH: 29.8 pg (ref 26.0–34.0)
MCHC: 32.6 g/dL (ref 30.0–36.0)
MCV: 91.6 fL (ref 80.0–100.0)
Platelets: 233 10*3/uL (ref 150–400)
RBC: 2.85 MIL/uL — ABNORMAL LOW (ref 4.22–5.81)
RDW: 15.4 % (ref 11.5–15.5)
WBC: 8.5 10*3/uL (ref 4.0–10.5)
nRBC: 0 % (ref 0.0–0.2)

## 2021-03-26 MED ORDER — METHOCARBAMOL 500 MG PO TABS
500.0000 mg | ORAL_TABLET | Freq: Four times a day (QID) | ORAL | 0 refills | Status: DC | PRN
  Filled 2021-03-26: qty 60, 8d supply, fill #0

## 2021-03-26 MED ORDER — ACETAMINOPHEN 325 MG PO TABS
500.0000 mg | ORAL_TABLET | Freq: Three times a day (TID) | ORAL | 0 refills | Status: DC
  Filled 2021-03-26: qty 90, 20d supply, fill #0

## 2021-03-26 MED ORDER — RIVAROXABAN 15 MG PO TABS
15.0000 mg | ORAL_TABLET | Freq: Every day | ORAL | 0 refills | Status: AC
  Filled 2021-03-26: qty 30, 30d supply, fill #0

## 2021-03-26 MED ORDER — ATENOLOL 25 MG PO TABS
12.5000 mg | ORAL_TABLET | Freq: Every day | ORAL | 0 refills | Status: DC
  Filled 2021-03-26: qty 5, 10d supply, fill #0

## 2021-03-26 MED ORDER — OXYCODONE-ACETAMINOPHEN 7.5-325 MG PO TABS
1.0000 | ORAL_TABLET | Freq: Three times a day (TID) | ORAL | 0 refills | Status: DC | PRN
  Filled 2021-03-26: qty 42, 7d supply, fill #0

## 2021-03-26 MED ORDER — DOCUSATE SODIUM 100 MG PO CAPS
100.0000 mg | ORAL_CAPSULE | Freq: Two times a day (BID) | ORAL | 0 refills | Status: AC
  Filled 2021-03-26: qty 10, 5d supply, fill #0

## 2021-03-26 MED ORDER — CHOLECALCIFEROL 125 MCG (5000 UT) PO TABS
1.0000 | ORAL_TABLET | Freq: Every day | ORAL | 6 refills | Status: AC
  Filled 2021-03-26: qty 30, 30d supply, fill #0

## 2021-03-26 MED ORDER — ASCORBIC ACID 1000 MG PO TABS
1000.0000 mg | ORAL_TABLET | Freq: Every day | ORAL | 0 refills | Status: AC
  Filled 2021-03-26: qty 60, 60d supply, fill #0

## 2021-03-26 NOTE — Discharge Instructions (Addendum)
Orthopaedic Trauma Service Discharge Instructions   General Discharge Instructions  Orthopaedic Injuries:  Right femur fracture treated with intramedullary nailing            Right tibial plateau fracture treated with open reduction and internal fixation using plate and screws             Right distal radius fracture treated with bracing   WEIGHT BEARING STATUS: Nonweightbearing Right leg and Right wrist.  Use platform walker to mobilize   RANGE OF MOTION/ACTIVITY: unrestricted range of motion right hip and knee.    Bone health: labs show vitamin d deficiency. Take vitamin d and vitamin c that has been prescribed   Wound Care: daily wound care starting when you get home. See instructions below   Discharge Wound Care Instructions  Do NOT apply any ointments, solutions or lotions to pin sites or surgical wounds.  These prevent needed drainage and even though solutions like hydrogen peroxide kill bacteria, they also damage cells lining the pin sites that help fight infection.  Applying lotions or ointments can keep the wounds moist and can cause them to breakdown and open up as well. This can increase the risk for infection. When in doubt call the office.  Surgical incisions should be dressed daily.  If any drainage is noted, use one layer of adaptic, then gauze, Kerlix, and an ace wrap.  Once the incision is completely dry and without drainage, it may be left open to air out.  Showering may begin 36-48 hours later.  Cleaning gently with soap and water.  Traumatic wounds should be dressed daily as well.    One layer of adaptic, gauze, Kerlix, then ace wrap.  The adaptic can be discontinued once the draining has ceased    If you have a wet to dry dressing: wet the gauze with saline the squeeze as much saline out so the gauze is moist (not soaking wet), place moistened gauze over wound, then place a dry gauze over the moist one, followed by Kerlix wrap, then ace wrap.  DVT/PE  prophylaxis:Xarelto 15 mg daily x 4 weeks   Diet: as you were eating previously.  Can use over the counter stool softeners and bowel preparations, such as Miralax, to help with bowel movements.  Narcotics can be constipating.  Be sure to drink plenty of fluids  PAIN MEDICATION USE AND EXPECTATIONS  You have likely been given narcotic medications to help control your pain.  After a traumatic event that results in an fracture (broken bone) with or without surgery, it is ok to use narcotic pain medications to help control one's pain.  We understand that everyone responds to pain differently and each individual patient will be evaluated on a regular basis for the continued need for narcotic medications. Ideally, narcotic medication use should last no more than 6-8 weeks (coinciding with fracture healing).   As a patient it is your responsibility as well to monitor narcotic medication use and report the amount and frequency you use these medications when you come to your office visit.   We would also advise that if you are using narcotic medications, you should take a dose prior to therapy to maximize you participation.  IF YOU ARE ON NARCOTIC MEDICATIONS IT IS NOT PERMISSIBLE TO OPERATE A MOTOR VEHICLE (MOTORCYCLE/CAR/TRUCK/MOPED) OR HEAVY MACHINERY DO NOT MIX NARCOTICS WITH OTHER CNS (CENTRAL NERVOUS SYSTEM) DEPRESSANTS SUCH AS ALCOHOL   POST-OPERATIVE OPIOID TAPER INSTRUCTIONS: It is important to wean off of your opioid  medication as soon as possible. If you do not need pain medication after your surgery it is ok to stop day one. Opioids include: Codeine, Hydrocodone(Norco, Vicodin), Oxycodone(Percocet, oxycontin) and hydromorphone amongst others.  Long term and even short term use of opiods can cause: Increased pain response Dependence Constipation Depression Respiratory depression And more.  Withdrawal symptoms can include Flu like symptoms Nausea, vomiting And more Techniques to manage  these symptoms Hydrate well Eat regular healthy meals Stay active Use relaxation techniques(deep breathing, meditating, yoga) Do Not substitute Alcohol to help with tapering If you have been on opioids for less than two weeks and do not have pain than it is ok to stop all together.  Plan to wean off of opioids This plan should start within one week post op of your fracture surgery  Maintain the same interval or time between taking each dose and first decrease the dose.  Cut the total daily intake of opioids by one tablet each day Next start to increase the time between doses. The last dose that should be eliminated is the evening dose.    STOP SMOKING OR USING NICOTINE PRODUCTS!!!!  As discussed nicotine severely impairs your body's ability to heal surgical and traumatic wounds but also impairs bone healing.  Wounds and bone heal by forming microscopic blood vessels (angiogenesis) and nicotine is a vasoconstrictor (essentially, shrinks blood vessels).  Therefore, if vasoconstriction occurs to these microscopic blood vessels they essentially disappear and are unable to deliver necessary nutrients to the healing tissue.  This is one modifiable factor that you can do to dramatically increase your chances of healing your injury.    (This means no smoking, no nicotine gum, patches, etc)  DO NOT USE NONSTEROIDAL ANTI-INFLAMMATORY DRUGS (NSAID'S)  Using products such as Advil (ibuprofen), Aleve (naproxen), Motrin (ibuprofen) for additional pain control during fracture healing can delay and/or prevent the healing response.  If you would like to take over the counter (OTC) medication, Tylenol (acetaminophen) is ok.  However, some narcotic medications that are given for pain control contain acetaminophen as well. Therefore, you should not exceed more than 4000 mg of tylenol in a day if you do not have liver disease.  Also note that there are may OTC medicines, such as cold medicines and allergy medicines  that my contain tylenol as well.  If you have any questions about medications and/or interactions please ask your doctor/PA or your pharmacist.      ICE AND ELEVATE INJURED/OPERATIVE EXTREMITY  Using ice and elevating the injured extremity above your heart can help with swelling and pain control.  Icing in a pulsatile fashion, such as 20 minutes on and 20 minutes off, can be followed.    Do not place ice directly on skin. Make sure there is a barrier between to skin and the ice pack.    Using frozen items such as frozen peas works well as the conform nicely to the are that needs to be iced.  USE AN ACE WRAP OR TED HOSE FOR SWELLING CONTROL  In addition to icing and elevation, Ace wraps or TED hose are used to help limit and resolve swelling.  It is recommended to use Ace wraps or TED hose until you are informed to stop.    When using Ace Wraps start the wrapping distally (farthest away from the body) and wrap proximally (closer to the body)   Example: If you had surgery on your leg or thing and you do not have a splint on,  start the ace wrap at the toes and work your way up to the thigh        If you had surgery on your upper extremity and do not have a splint on, start the ace wrap at your fingers and work your way up to the upper arm  IF YOU ARE IN A SPLINT OR CAST DO NOT REMOVE IT FOR ANY REASON   If your splint gets wet for any reason please contact the office immediately. You may shower in your splint or cast as long as you keep it dry.  This can be done by wrapping in a cast cover or garbage back (or similar)  Do Not stick any thing down your splint or cast such as pencils, money, or hangers to try and scratch yourself with.  If you feel itchy take benadryl as prescribed on the bottle for itching  IF YOU ARE IN A CAM BOOT (BLACK BOOT)  You may remove boot periodically. Perform daily dressing changes as noted below.  Wash the liner of the boot regularly and wear a sock when wearing the boot.  It is recommended that you sleep in the boot until told otherwise    Call office for the following: Temperature greater than 101F Persistent nausea and vomiting Severe uncontrolled pain Redness, tenderness, or signs of infection (pain, swelling, redness, odor or green/yellow discharge around the site) Difficulty breathing, headache or visual disturbances Hives Persistent dizziness or light-headedness Extreme fatigue Any other questions or concerns you may have after discharge  In an emergency, call 911 or go to an Emergency Department at a nearby hospital  HELPFUL INFORMATION  If you had a block, it will wear off between 8-24 hrs postop typically.  This is period when your pain may go from nearly zero to the pain you would have had postop without the block.  This is an abrupt transition but nothing dangerous is happening.  You may take an extra dose of narcotic when this happens.  You should wean off your narcotic medicines as soon as you are able.  Most patients will be off or using minimal narcotics before their first postop appointment.   We suggest you use the pain medication the first night prior to going to bed, in order to ease any pain when the anesthesia wears off. You should avoid taking pain medications on an empty stomach as it will make you nauseous.  Do not drink alcoholic beverages or take illicit drugs when taking pain medications.  In most states it is against the law to drive while you are in a splint or sling.  And certainly against the law to drive while taking narcotics.  You may return to work/school in the next couple of days when you feel up to it.   Pain medication may make you constipated.  Below are a few solutions to try in this order: Decrease the amount of pain medication if you aren't having pain. Drink lots of decaffeinated fluids. Drink prune juice and/or each dried prunes  If the first 3 don't work start with additional solutions Take Colace - an  over-the-counter stool softener Take Senokot - an over-the-counter laxative Take Miralax - a stronger over-the-counter laxative     CALL THE OFFICE WITH ANY QUESTIONS OR CONCERNS: [phone]   VISIT OUR WEBSITE FOR ADDITIONAL INFORMATION: [email or web address]

## 2021-03-26 NOTE — Care Management (Signed)
03-26-21 1321 MATCH is completed for this patient. [facility] will deliver medications to this patient.
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
## 2021-03-26 NOTE — Progress Notes (Signed)
Patient stable.  Pain controlled. No real medical issues currently. I think he would do well try CPM if that works with orthopedic trauma service From the perspective of the treatment of his femur fracture he is ready for discharge to home Anticipate discharge tomorrow.
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
## 2021-03-26 NOTE — Progress Notes (Signed)
Called the office of Dr Carola Frost. Phone disconnected during message.

## 2021-03-27 ENCOUNTER — Inpatient Hospital Stay (HOSPITAL_COMMUNITY): Payer: Self-pay

## 2021-03-27 DIAGNOSIS — M7989 Other specified soft tissue disorders: Secondary | ICD-10-CM
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
## 2021-03-27 NOTE — Care Management (Addendum)
2:00 Spoke w patient's mom. She has WC and 3/1 at home. Will need platform RW to go home with. Spoke w Rayfield Citizen at [employer].She will arrange for platform RW will be brought to the room today so he can take it home w him. Mother appreciative. 15:45 Per [employer] Citizen they are out of stock for platform walkers. Spoke w Vaughan Basta at [facility], they are on backorder for them as well. [employer]field Citizen will have it delivered to the patient's house when they are back in stock. For now, patient will DC with WC and 3/1 that are already at home. Patient's mother is unhappy about this but agreeable. Family will transport home via private car.

## 2021-03-27 NOTE — Progress Notes (Signed)
Mccall Pete Glatter D/C'd Home per MD order. D/C instructions discussed with the patient and all questions answered.  An After Visit Summary was printed and given to the patient.  Patient escorted via WC, and D/C home via private auto. Patient belongings and valuables sent with patient. DME delivered at home. Marylu Lund  03/27/2021 4:54 PM

## 2021-03-27 NOTE — Progress Notes (Signed)
Lower extremity venous RT study completed.  Preliminary results relayed to [facility], RN via chat.   See CV Proc for preliminary results report.   Jean Rosenthal, RDMS, RVT

## 2021-03-28 NOTE — Progress Notes (Signed)
Patient doing well this morning without any events overnight.  He is eager to go home.  He is mobilizing with physical therapy well.  Surgical bandages are all intact.  Plan is for discharge home after next PT session.

## 2021-03-28 NOTE — Discharge Summary (Signed)
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 visualize due                                                   to patient position      +----+---------------+---------+-----------+----------+------------------------+     Summary: RIGHT: - There is no evidence of deep vein thrombosis in the lower extremity.  - No cystic structure found in the popliteal fossa.   *See table(s) above for measurements and observations. Electronically signed by Heath Larkhomas Hawken on 03/27/2021 at 7:15:15 PM.    Final     Disposition: Discharge disposition: 01-Home or Self Care       Discharge Instructions     Call MD / Call 911   Complete by: As directed    If you experience chest pain or shortness of breath, CALL 911 and be transported to the hospital emergency room.  If you develope a fever above 101.5 F, pus (white drainage) or increased drainage or redness at the wound, or calf pain, call your surgeon's office.   Constipation Prevention   Complete by: As directed    Drink plenty of fluids.  Prune juice may be helpful.  You may use a stool softener, such as Colace (over the  counter) 100 mg twice a day.  Use MiraLax (over the counter) for constipation as needed.   Driving restrictions   Complete by: As directed    No driving while taking narcotic pain meds.   Increase activity slowly as tolerated   Complete by: As directed    Post-operative opioid taper instructions:   Complete by: As directed    POST-OPERATIVE OPIOID TAPER INSTRUCTIONS: It is important to wean off of your opioid medication as soon as possible. If you do not need pain medication after your surgery it is ok to stop day one. Opioids include: Codeine, Hydrocodone(Norco, Vicodin), Oxycodone(Percocet, oxycontin) and hydromorphone amongst others.  Long term and even short term use of opiods can cause: Increased pain response Dependence Constipation Depression Respiratory depression And more.  Withdrawal symptoms can include Flu like symptoms Nausea, vomiting And more Techniques to manage these symptoms Hydrate well Eat regular healthy meals Stay active Use relaxation techniques(deep breathing, meditating, yoga) Do Not substitute Alcohol to help with tapering If you have been on opioids for less than two weeks and do not have pain than it is ok to stop all together.  Plan to wean off of opioids This plan should start within one week post op of your joint replacement. Maintain the same interval or time between taking each dose and first decrease the dose.  Cut the total daily intake of opioids by one tablet each day Next start to increase the time between doses. The last dose that should be eliminated is the evening dose.           Follow-up Information     Myrene GalasHandy, Michael, MD. Schedule an appointment as soon as possible for a visit in 2 week(s).   Specialty: Orthopedic Surgery Contact information: [address] [place] [address] [phone]                  Signed: Glee ArvinMichael Lymon Kidney 03/28/2021, 8:01 AM

## 2021-03-29 LAB — TRANSFUSION REACTION
DAT C3: NEGATIVE
Post RXN DAT IgG: NEGATIVE

## 2021-04-05 ENCOUNTER — Telehealth (HOSPITAL_COMMUNITY): Payer: Self-pay | Admitting: Pharmacist

## 2021-04-05 ENCOUNTER — Other Ambulatory Visit (HOSPITAL_COMMUNITY): Payer: Self-pay
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
## 2021-04-05 NOTE — Telephone Encounter (Signed)
No answer, will contact pt again at a later date.

## 2021-04-15 ENCOUNTER — Encounter: Payer: Self-pay | Admitting: Physician Assistant

## 2021-04-15 ENCOUNTER — Ambulatory Visit: Payer: PRIVATE HEALTH INSURANCE | Admitting: Physician Assistant

## 2021-04-15 ENCOUNTER — Other Ambulatory Visit: Payer: Self-pay

## 2021-04-15 VITALS — BP 132/84 | HR 98 | Temp 98.7°F | Resp 18 | Ht 69.0 in | Wt 248.0 lb

## 2021-04-15 DIAGNOSIS — Z8781 Personal history of (healed) traumatic fracture: Secondary | ICD-10-CM | POA: Diagnosis not present

## 2021-04-15 DIAGNOSIS — E559 Vitamin D deficiency, unspecified: Secondary | ICD-10-CM | POA: Diagnosis not present

## 2021-04-15 DIAGNOSIS — D62 Acute posthemorrhagic anemia: Secondary | ICD-10-CM

## 2021-04-15 DIAGNOSIS — S82141S Displaced bicondylar fracture of right tibia, sequela: Secondary | ICD-10-CM | POA: Diagnosis not present

## 2021-04-15 DIAGNOSIS — S52501S Unspecified fracture of the lower end of right radius, sequela: Secondary | ICD-10-CM | POA: Diagnosis not present

## 2021-04-15 DIAGNOSIS — E876 Hypokalemia: Secondary | ICD-10-CM

## 2021-04-15 MED ORDER — ACETAMINOPHEN 325 MG PO TABS
500.0000 mg | ORAL_TABLET | Freq: Three times a day (TID) | ORAL | 0 refills | Status: AC
Start: 2021-04-15 — End: ?

## 2021-04-15 MED ORDER — OXYCODONE-ACETAMINOPHEN 7.5-325 MG PO TABS
1.0000 | ORAL_TABLET | Freq: Three times a day (TID) | ORAL | 0 refills | Status: AC | PRN
Start: 2021-04-15 — End: 2022-04-15

## 2021-04-15 MED ORDER — METHOCARBAMOL 500 MG PO TABS
500.0000 mg | ORAL_TABLET | Freq: Four times a day (QID) | ORAL | 0 refills | Status: AC | PRN
Start: 2021-04-15 — End: ?

## 2021-04-15 NOTE — Patient Instructions (Addendum)
Douglas Galas, MD. Schedule an appointment as soon as possible for a visit in 2 week(s).   Specialty: Orthopedic Surgery Contact information: [address] [place] [address] [phone]   Make sure that you contact orthopedics as soon as possible for follow-up.  Make sure that you are taking vitamin D, your prescription should be for 50,000 units once a week.  Please contact our office if you do not have this current prescription.  We will call you with today's lab results.  Roney Jaffe, PA-C Physician Assistant [facility] [email or web address]   Opioid Pain Medicine Management Opioids are powerful medicines that are used to treat moderate to severe pain. When used for short periods of time, they can help you: Sleep better. Do better in physical or occupational therapy. Feel better in the first few days after an injury. Recover from surgery. Opioids should be taken with the supervision of a trained health care provider. They should be taken for the shortest period of time possible. This is because opioids can be addictive, and the longer you take opioids, the greater your risk of addiction (opioid use disorder). What are the risks? Using opioid pain medicines for longer than 3 days increases your risk of these side effects. Opioids can cause side effects, such as: Constipation. Nausea. Vomiting. Drowsiness. Confusion. Opioid use disorder. Breathing difficulties (respiratory depression). Taking opioid pain medicine for a long period of time can affect your ability to do daily tasks. It also puts you at risk for: Motor vehicle accidents. Depression. Suicide. Heart attack. Overdose, which can sometimes lead to death. What is a pain treatment plan? A pain treatment plan is an agreement between you and your health care provider. Pain is unique to each person, and treatments vary depending on your condition. To  manage your pain successfully, you and your health care provider need to understand each other and work together. To help you do this: Discuss the goals of your treatment, including how much pain you might expect to have and how you will manage the pain. Review the risks and benefits of taking opioid medicines for your condition. Remember that a good treatment plan uses more than one approach and minimizes the chance of side effects. Be honest about the amount of medicines you take and about any drug or alcohol use. Get pain medicine prescriptions from only one health care provider. Pain can be managed with many types of alternative treatments. Ask your health care provider to refer you to one or more specialists who can help you manage pain through: Physical or occupational therapy. Counseling (cognitive behavioral therapy). Good nutrition. Biofeedback. Massage. Meditation. Non-opioid medicine. Following a gentle exercise program. Tapering your use of opioids If you have been taking opioid medicine for more than a few weeks, you may need to slowly decrease (taper) how much you take until you stop completely. Tapering your use of opioids can decrease your chances of experiencing withdrawal symptoms, such as: Pain and cramping in the abdomen. Nausea. Sweating. Sleepiness. Restlessness. Uncontrollable shaking (tremors). Cravings for the medicine. Do not attempt to taper your use of opioids on your own. Talk with your health care provider about how to do this. Your health care provider may prescribe a step-down schedule based on how much medicine you are taking and how long youhave been taking it. Follow these instructions at home: Safety and storage  While you are taking opioid pain medicine: Do not drive. Do not use machinery or power tools.  Do not sign legal documents. Do not drink alcohol. Do not take sleeping pills. Do not supervise children by yourself. Do not participate in  activities that require climbing or being in high places. Do not enter a body of water, such as a lake, river, ocean, spa, or swimming pool. Keep pain medicine in a locked cabinet or in a secure area where children cannot reach it. Do not share your pain medicine with anyone.  Getting rid of leftover pills Do not save any leftover pills. Get rid of leftover pills safely by: Taking the medicine to a prescription take-back program. This is usually offered by the county or law enforcement. Bringing them to a pharmacy that has a drug disposal container. Flushing them down the toilet. Check the label or package insert of your medicine to see whether this is safe to do. Throwing them out in the trash. Check the label or package insert of your medicine to see whether this is safe to do. If it is safe to throw it out, remove the medicine from the original container, put it into a sealable bag or container, and mix it with used coffee grounds, food scraps, dirt, or cat litter before putting it in the trash. Activity Return to your normal activities as told by your health care provider. Ask your health care provider what activities are safe for you. Avoid activities that make your pain worse. Do exercises as told by your health care provider. General instructions You may need to take these actions to prevent or treat constipation: Drink enough fluid to keep your urine pale yellow. Take over-the-counter or prescription medicines. Eat foods that are high in fiber, such as beans, whole grains, and fresh fruits and vegetables. Limit foods that are high in fat and processed sugars, such as fried or sweet foods. Keep all follow-up visits as told by your health care provider. This is important. Where to find support If you have been taking opioids for a long time, you may benefit from receiving support for quitting from a local support group or counselor. Ask your healthcare provider for a referral to these  resources in your area. Where to find more information Centers for Disease Control and Prevention (CDC): FootballExhibition.com.br [place] Food and Drug Administration (FDA): PumpkinSearch.com.ee Get help right away if: Seek medical care right away if you are taking opioids and you, or people close to you, notice any of the following: Difficulty breathing. Breathing that is slower or more shallow than normal. A very slow heartbeat (pulse). Severe confusion. Unconsciousness. Sleepiness. Slurred speech. Nausea and vomiting. Cold, clammy skin. Blue lips or fingernails. Limpness. Abnormally small pupils. If you think that you or someone else may have taken too much of an opioid medicine, get medical help right away. Call your local emergency services (911 in the [place] Do not drive yourself to the hospital. If you ever feel like you may hurt yourself or others, or have thoughts about taking your own life, get help right away. You can go to your nearest emergency department or call: Your local emergency services (911 in the [place] The hotline of the [facility] (743)379-2421 in the [place] A suicide crisis helpline, such as the National Suicide Prevention Lifeline at 820-061-0763. This is open 24 hours a day. Summary Opioid medicines can help you manage moderate to severe pain for a short period of time. A pain treatment plan is an agreement between you and your health care provider. Discuss the goals of your treatment,  including how much pain you might expect to have and how you will manage the pain. Pain can be managed with many types of alternative treatments. If you think that you or someone else may have taken too much of an opioid, get medical help right away. This information is not intended to replace advice given to you by your health care provider. Make sure you discuss any questions you have with your healthcare provider. Document Revised: 08/09/2019 Document Reviewed:  11/02/2018 Elsevier Patient Education  2022 [employer].

## 2021-04-15 NOTE — Progress Notes (Signed)
Patient has not taken medication today. Patient has eaten today. Patient complains of pain in the right leg.
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
## 2021-04-16 DIAGNOSIS — D62 Acute posthemorrhagic anemia: Secondary | ICD-10-CM | POA: Insufficient documentation

## 2021-04-16 DIAGNOSIS — E876 Hypokalemia: Secondary | ICD-10-CM | POA: Insufficient documentation

## 2021-04-16 LAB — COMP. METABOLIC PANEL (12)
AST: 12 IU/L (ref 0–40)
Albumin/Globulin Ratio: 1.6 (ref 1.2–2.2)
Albumin: 4.4 g/dL (ref 4.0–5.0)
Alkaline Phosphatase: 86 IU/L (ref 44–121)
BUN/Creatinine Ratio: 12 (ref 9–20)
BUN: 13 mg/dL (ref 6–20)
Bilirubin Total: 0.3 mg/dL (ref 0.0–1.2)
Calcium: 9.7 mg/dL (ref 8.7–10.2)
Chloride: 101 mmol/L (ref 96–106)
Creatinine, Ser: 1.06 mg/dL (ref 0.76–1.27)
Globulin, Total: 2.7 g/dL (ref 1.5–4.5)
Glucose: 97 mg/dL (ref 65–99)
Potassium: 4.4 mmol/L (ref 3.5–5.2)
Sodium: 139 mmol/L (ref 134–144)
Total Protein: 7.1 g/dL (ref 6.0–8.5)
eGFR: 93 mL/min/{1.73_m2} (ref >59)

## 2021-04-16 LAB — CBC WITH DIFFERENTIAL/PLATELET
Basophils Absolute: 0.1 10*3/uL (ref 0.0–0.2)
Basos: 1 %
EOS (ABSOLUTE): 0.2 10*3/uL (ref 0.0–0.4)
Eos: 3 %
Hematocrit: 40 % (ref 37.5–51.0)
Hemoglobin: 13.4 g/dL (ref 13.0–17.7)
Immature Grans (Abs): 0 10*3/uL (ref 0.0–0.1)
Immature Granulocytes: 0 %
Lymphocytes Absolute: 1.8 10*3/uL (ref 0.7–3.1)
Lymphs: 31 %
MCH: 30.5 pg (ref 26.6–33.0)
MCHC: 33.5 g/dL (ref 31.5–35.7)
MCV: 91 fL (ref 79–97)
Monocytes Absolute: 0.6 10*3/uL (ref 0.1–0.9)
Monocytes: 11 %
Neutrophils Absolute: 3.2 10*3/uL (ref 1.4–7.0)
Neutrophils: 54 %
Platelets: 316 10*3/uL (ref 150–450)
RBC: 4.39 x10E6/uL (ref 4.14–5.80)
RDW: 14.5 % (ref 11.6–15.4)
WBC: 5.9 10*3/uL (ref 3.4–10.8)

## 2021-04-20 ENCOUNTER — Telehealth: Payer: Self-pay | Admitting: *Deleted

## 2021-04-20 NOTE — Telephone Encounter (Signed)
MA spoke with patients mother and asked her to share with patient of the phone call and need to contact the office back.

## 2021-05-26 ENCOUNTER — Ambulatory Visit: Payer: Self-pay | Admitting: Family Medicine

## 2021-06-08 ENCOUNTER — Telehealth: Payer: Self-pay | Admitting: Family

## 2021-08-18 IMAGING — CT CT ANGIO CHEST-ABD-PELV FOR DISSECTION W/ AND WO/W CM
2 of 19 series · 10 of 46 positions shown, 17 images · IV contrast (OMNI 350)
Comparison: Four days ago

CLINICAL DATA: Chest abdomen and pelvis injury suspected.

Study read with no [REDACTED] access due to recent update.
EXAM:
CT ANGIOGRAPHY CHEST, ABDOMEN AND PELVIS
TECHNIQUE: Non-contrast CT of the chest was initially obtained.

[Series 13: dissection 2mm cor · coronal · 0.94mm/px · 1 of 164 slices shown, 2 images]
[im 82/164  soft-tissue]
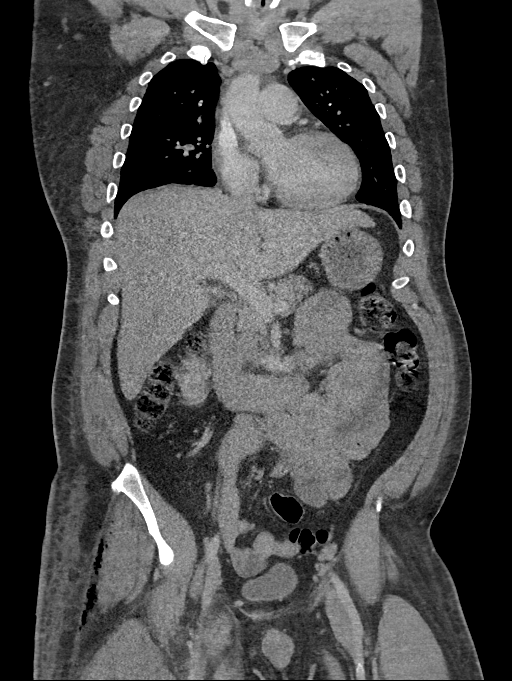
[im 82/164  bone]
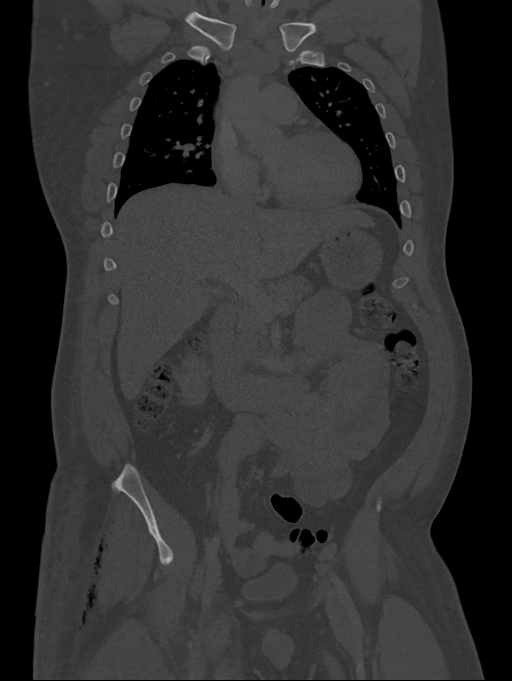

[Series 18: dissection thins · axial · 0.85mm/px · z∈[+1052,+1569]mm · 9 of 1293 slices shown, 15 images]
[im 130/1293  soft-tissue]
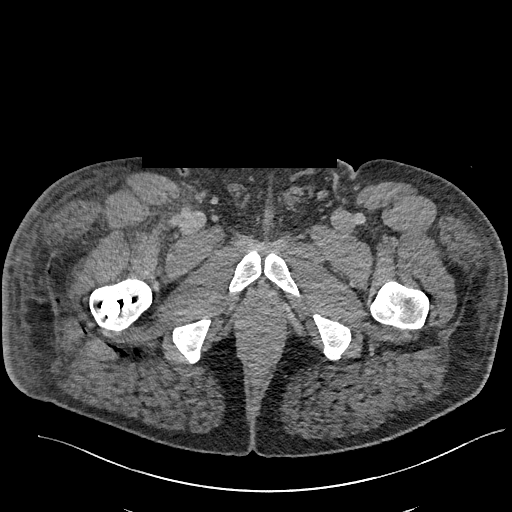
[im 130/1293  bone]
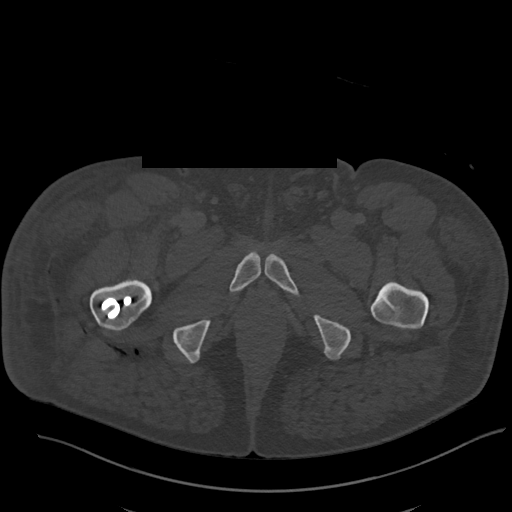
[im 259/1293  soft-tissue]
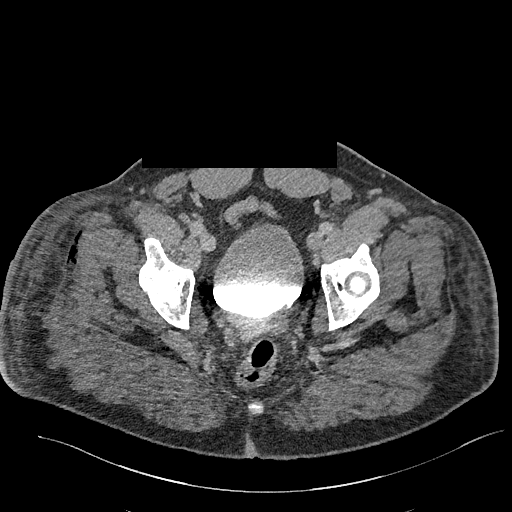
[im 388/1293  soft-tissue]
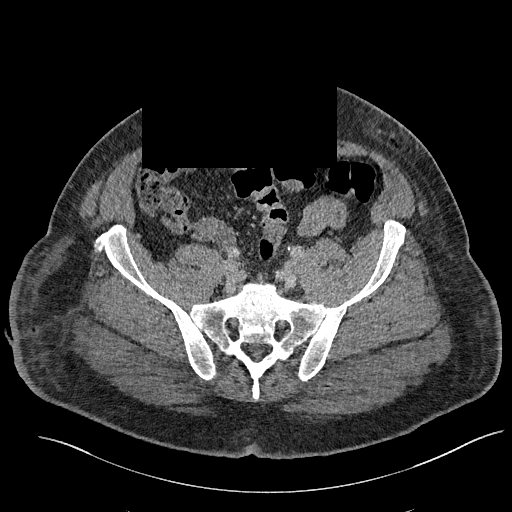
[im 517/1293  soft-tissue]
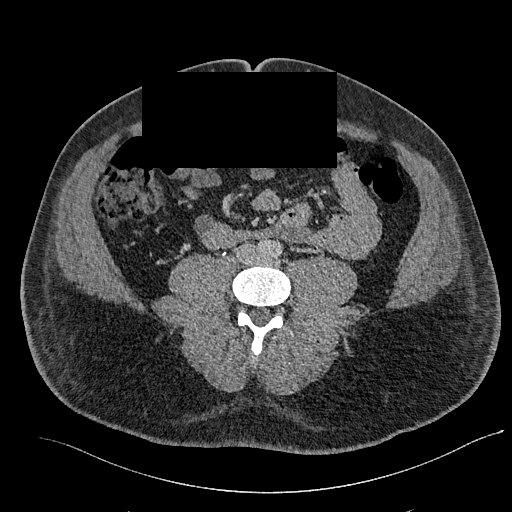
[im 647/1293  soft-tissue]
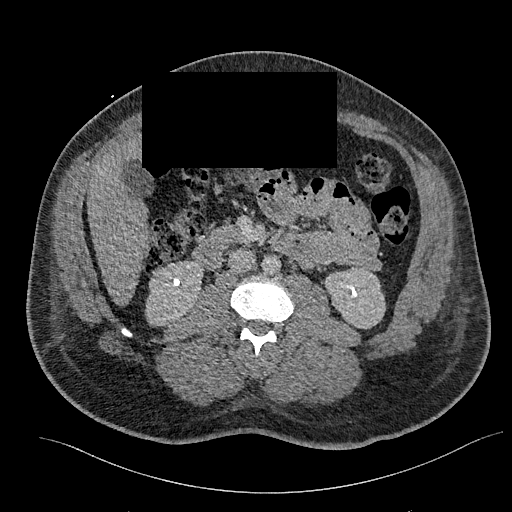
[im 776/1293  soft-tissue]
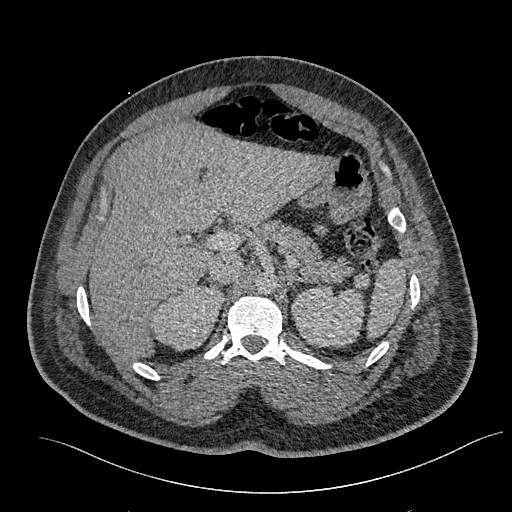
[im 776/1293  lung]
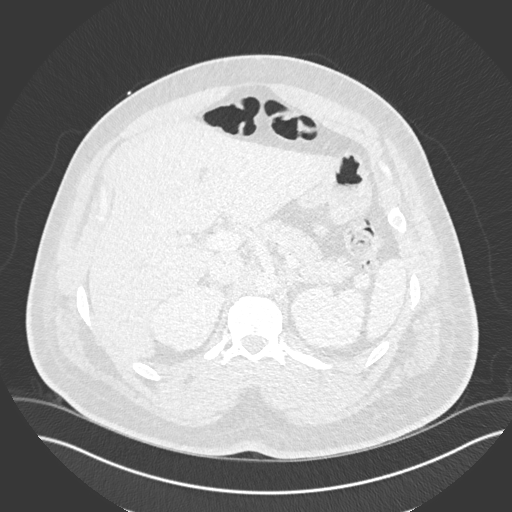
[im 905/1293  soft-tissue]
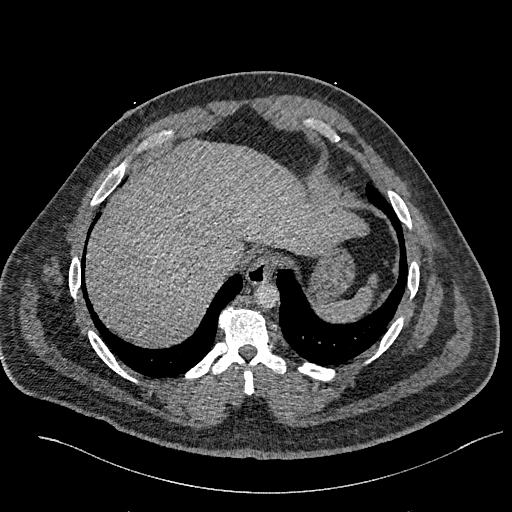
[im 905/1293  lung]
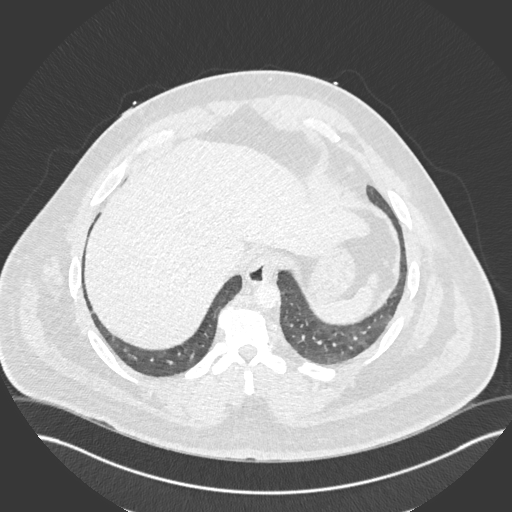
[im 1034/1293  soft-tissue]
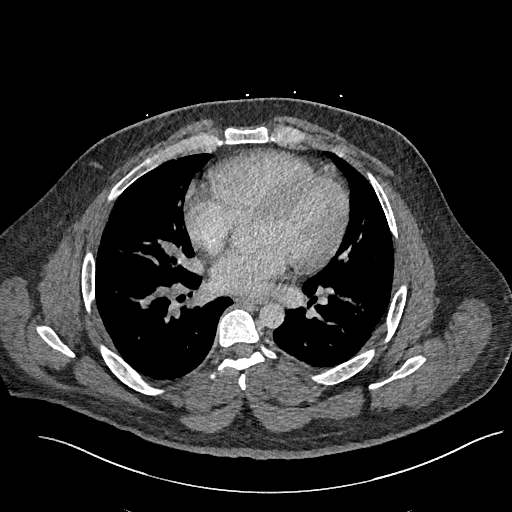
[im 1034/1293  lung]
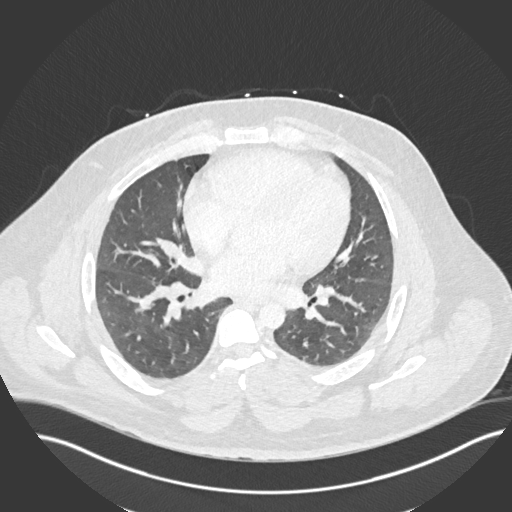
[im 1163/1293  soft-tissue]
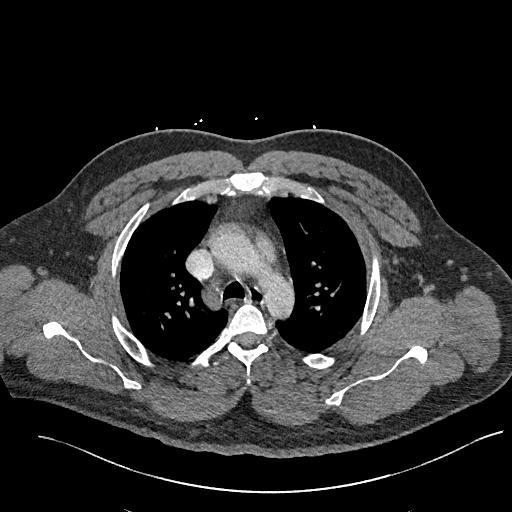
[im 1163/1293  lung]
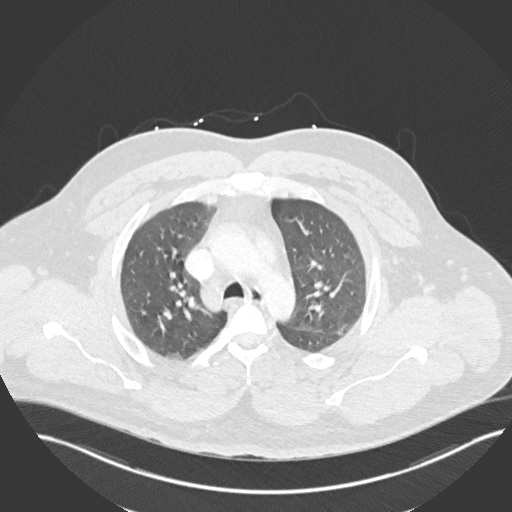
[im 1163/1293  bone]
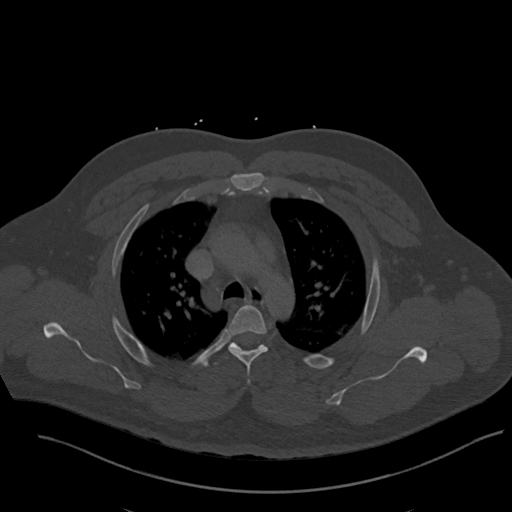

[10 of 46 positions shown; findings below may reference images not displayed]

Multidetector CT imaging through the chest, abdomen and pelvis was
performed using the standard protocol during bolus administration of
intravenous contrast. Multiplanar reconstructed images and MIPs were
obtained and reviewed to evaluate the vascular anatomy.

CONTRAST:  150mL OMNIPAQUE IOHEXOL 350 MG/ML SOLN
FINDINGS: The first bolus is not visible on the scan and a second bolus is
diffused such that arterial opacification is suboptimal.

CTA CHEST FINDINGS

Cardiovascular: Preferential opacification of the thoracic aorta. No
evidence of thoracic aortic aneurysm or dissection. Normal heart
size. No pericardial effusion.

Mediastinum/Nodes: Negative for hematoma or pneumomediastinum. 16 mm
left thyroid nodule.

Lungs/Pleura: Clearing of the lingula. Generalized airway thickening
with mosaic attenuation lungs.

Musculoskeletal: No acute finding

Review of the MIP images confirms the above findings.

CTA ABDOMEN AND PELVIS FINDINGS

VASCULAR

Aorta: No visible injury.  Mild atheromatous plaque distally.

Celiac: Unremarkable

SMA: Unremarkable

Renals: Unremarkable

IMA: Patent

Inflow: No visible injury

Veins: Unremarkable

Review of the MIP images confirms the above findings.

NON-VASCULAR

Hepatobiliary: No hepatic injury or perihepatic hematoma.
Gallbladder is unremarkable

Pancreas: Negative

Spleen: No splenic injury or perisplenic hematoma.

Adrenals/Urinary Tract: No adrenal hemorrhage or renal injury
identified. Bladder is unremarkable.

Stomach/Bowel: No visible injury

Lymphatic: No adenopathy

Reproductive: Negative

Other: No ascites or pneumoperitoneum

Musculoskeletal: Soft tissue swelling and gas in the upper right
thigh and lower gluteal region related to femur fracture repair. No
acute fracture or subluxation.

Review of the MIP images confirms the above findings.
IMPRESSION: 1. Limited by IV function, diagnostic CTA timing was not achieved.
2. Pulmonary contusion on prior has resolved.  No newly seen injury.
3. Airway thickening and patchy air trapping.
4. 16 mm left thyroid nodule. After discharge, recommend thyroid US
(ref: [HOSPITAL]. [DATE]): 143-50).
